# Patient Record
Sex: Female | Born: 1970
Health system: Southern US, Community
[De-identification: ages and names within clinical notes are randomized; demographics above are authoritative.]

## PROBLEM LIST (undated history)

## (undated) DIAGNOSIS — A64 Unspecified sexually transmitted disease: Secondary | ICD-10-CM

## (undated) HISTORY — DX: Unspecified sexually transmitted disease: A64

---

## 2003-05-29 ENCOUNTER — Emergency Department (HOSPITAL_COMMUNITY): Admission: EM | Admit: 2003-05-29 | Discharge: 2003-05-29 | Payer: Self-pay | Admitting: Emergency Medicine

## 2003-05-29 ENCOUNTER — Encounter: Payer: Self-pay | Admitting: Emergency Medicine

## 2004-06-25 ENCOUNTER — Emergency Department: Payer: Self-pay | Admitting: Emergency Medicine

## 2006-02-19 ENCOUNTER — Ambulatory Visit: Payer: Self-pay | Admitting: Family Medicine

## 2006-03-28 ENCOUNTER — Emergency Department: Payer: Self-pay | Admitting: Emergency Medicine

## 2007-05-30 IMAGING — CR DG HAND COMPLETE 3+V*L*
1 series · 3 of 3 positions shown · non-contrast
Comparison: none

REASON FOR EXAM: Pain and swelling
                    CALL REPORT
COMMENTS:

PROCEDURE:     DXR - DXR HAND LT COMPLETE  W/OBLIQUES  - February 19, 2006  [DATE]
RESULT:     Three views of the LEFT hand show no fracture, dislocation or
other significant osseous abnormality. No definite arthritic changes are
identified.

[Series 1: view not recorded · 0.17mm/px · 3 of 3 slices shown]
[im 1/3]
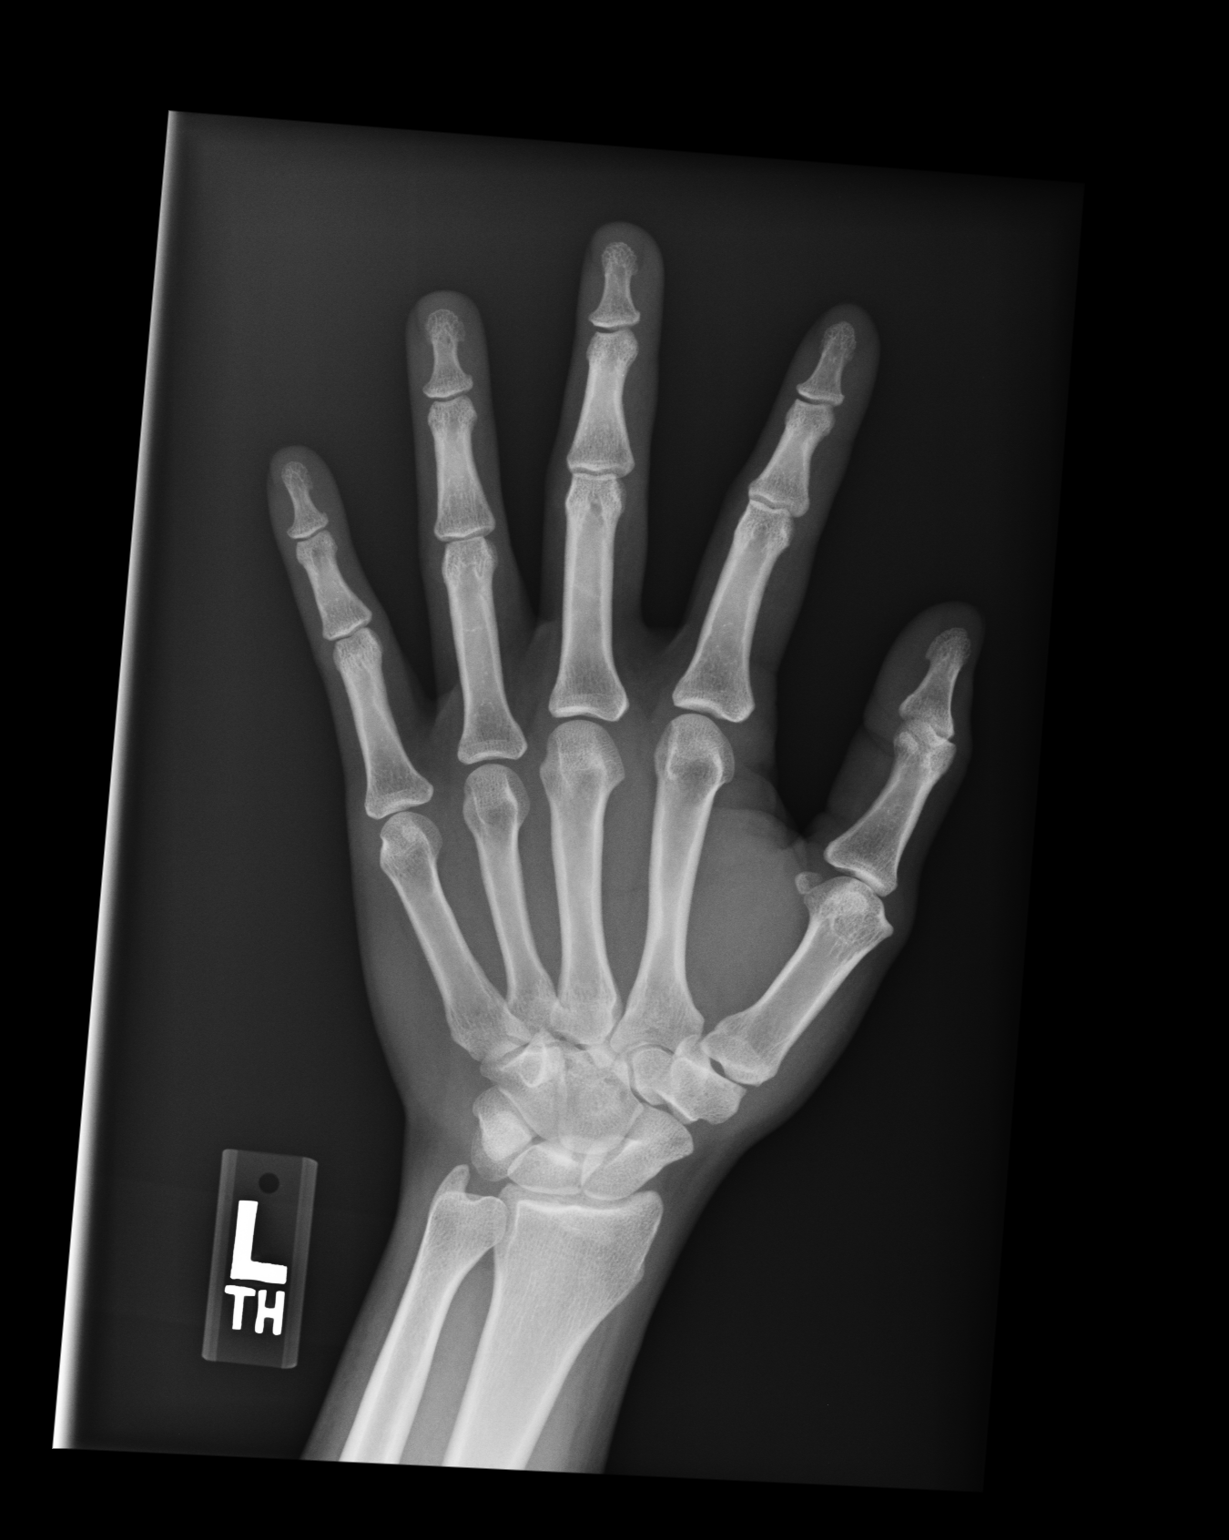
[im 2/3]
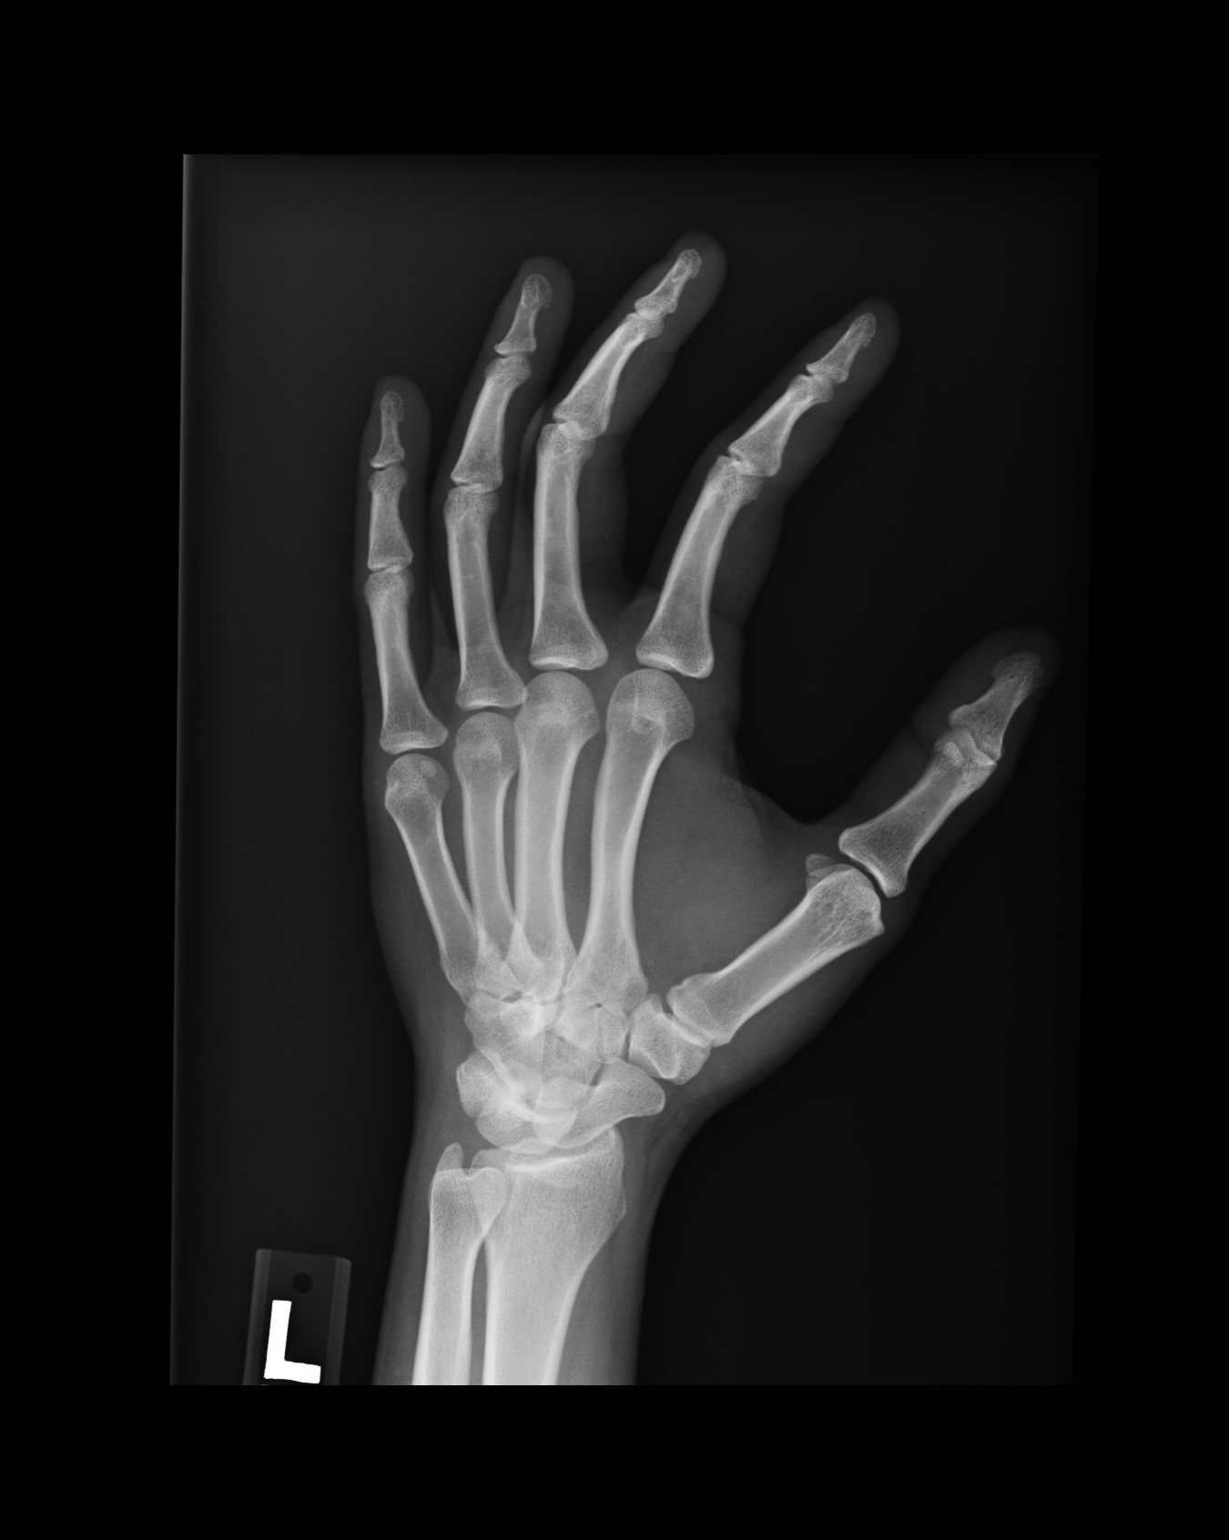
[im 3/3]
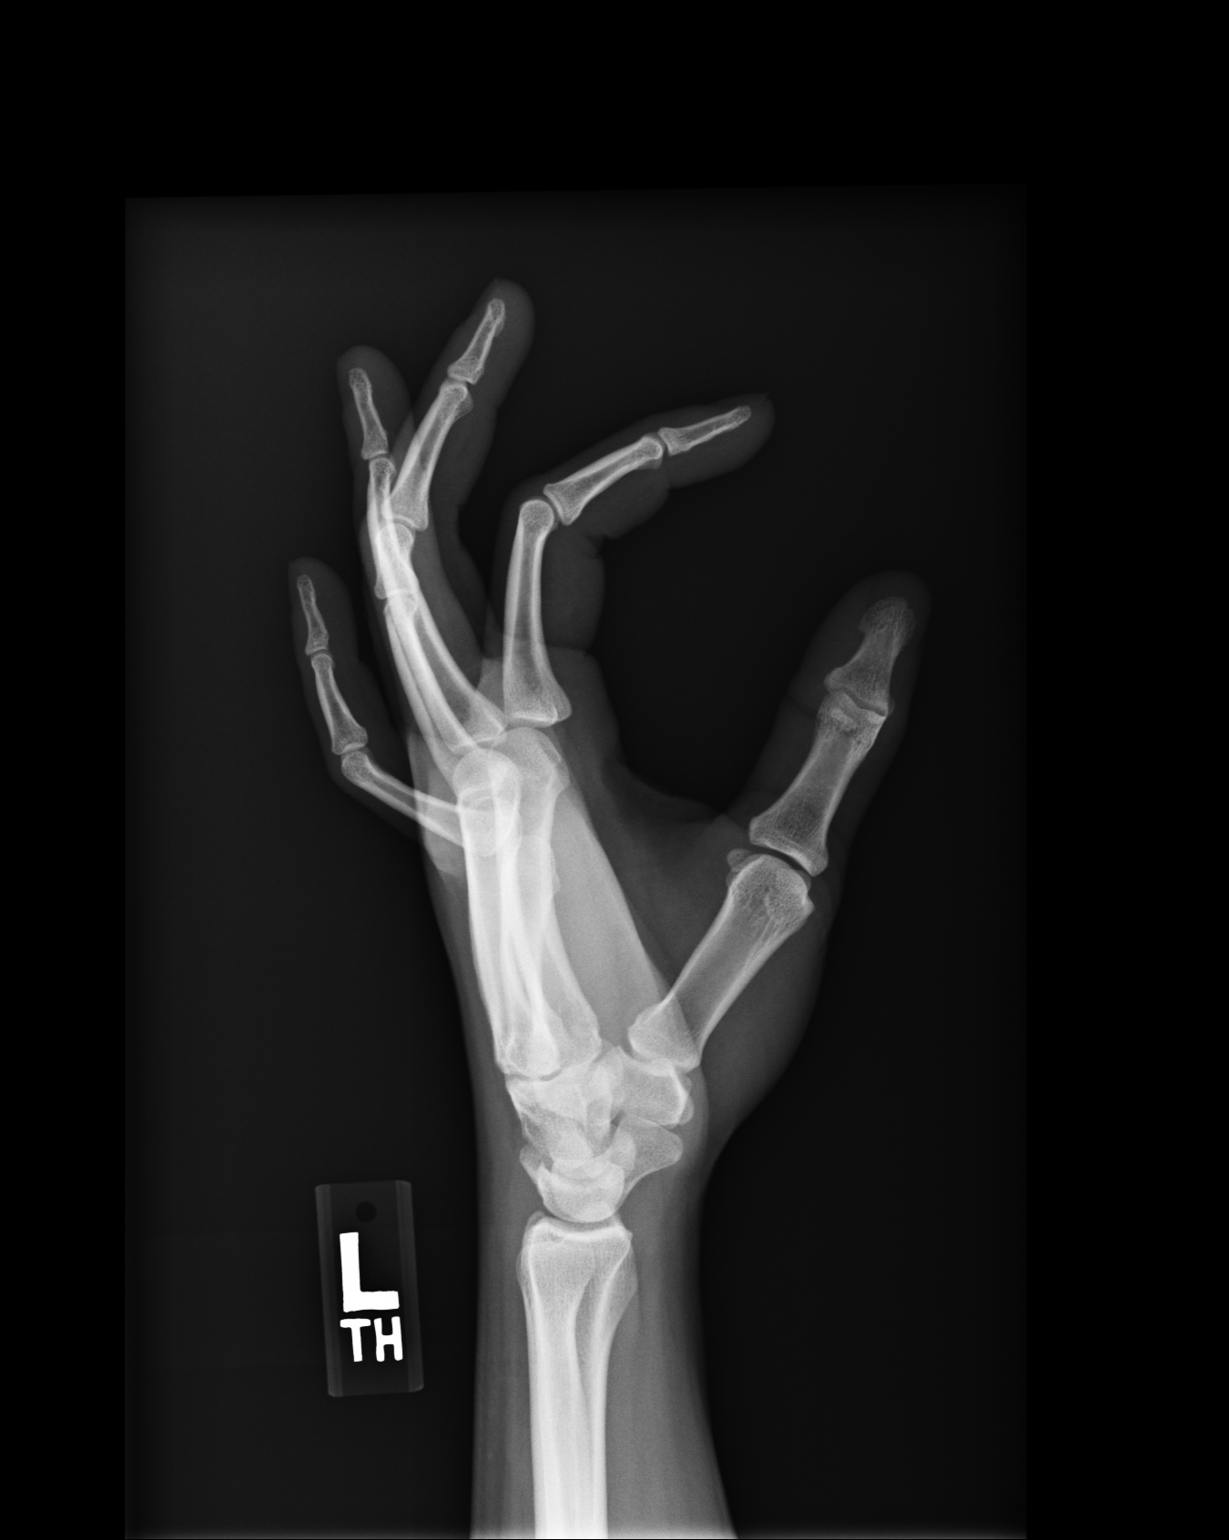

[3 of 3 positions shown; findings below may reference images not displayed]

IMPRESSION: No significant osseous abnormalities are identified.

## 2008-11-30 ENCOUNTER — Emergency Department: Payer: Self-pay | Admitting: Emergency Medicine

## 2010-11-28 ENCOUNTER — Emergency Department: Payer: Self-pay | Admitting: Emergency Medicine

## 2011-10-03 ENCOUNTER — Emergency Department: Payer: Self-pay | Admitting: Emergency Medicine

## 2015-04-20 ENCOUNTER — Encounter: Payer: Self-pay | Admitting: Emergency Medicine

## 2015-04-20 ENCOUNTER — Emergency Department
Admission: EM | Admit: 2015-04-20 | Discharge: 2015-04-20 | Disposition: A | Discharge status: Home / Self Care | Payer: Self-pay | Attending: Emergency Medicine | Admitting: Emergency Medicine

## 2015-04-20 DIAGNOSIS — B349 Viral infection, unspecified: Secondary | ICD-10-CM | POA: Insufficient documentation

## 2015-04-20 DIAGNOSIS — R10812 Left upper quadrant abdominal tenderness: Secondary | ICD-10-CM | POA: Insufficient documentation

## 2015-04-20 DIAGNOSIS — Z720 Tobacco use: Secondary | ICD-10-CM | POA: Insufficient documentation

## 2015-04-20 DIAGNOSIS — Z3202 Encounter for pregnancy test, result negative: Secondary | ICD-10-CM | POA: Insufficient documentation

## 2015-04-20 LAB — URINALYSIS COMPLETE WITH MICROSCOPIC (ARMC ONLY)
Bilirubin Urine: NEGATIVE
Glucose, UA: NEGATIVE mg/dL
KETONES UR: NEGATIVE mg/dL
Nitrite: NEGATIVE
PH: 6 (ref 5.0–8.0)
PROTEIN: NEGATIVE mg/dL
Specific Gravity, Urine: 1.021 (ref 1.005–1.030)

## 2015-04-20 LAB — COMPREHENSIVE METABOLIC PANEL
ALT: 12 U/L — ABNORMAL LOW (ref 14–54)
AST: 14 U/L — ABNORMAL LOW (ref 15–41)
Albumin: 3.5 g/dL (ref 3.5–5.0)
Alkaline Phosphatase: 87 U/L (ref 38–126)
Anion gap: 6 (ref 5–15)
BUN: 7 mg/dL (ref 6–20)
CO2: 26 mmol/L (ref 22–32)
Calcium: 9 mg/dL (ref 8.9–10.3)
Chloride: 107 mmol/L (ref 101–111)
Creatinine, Ser: 0.82 mg/dL (ref 0.44–1.00)
GFR calc Af Amer: >60 mL/min (ref >60)
GFR calc non Af Amer: >60 mL/min (ref >60)
Glucose, Bld: 103 mg/dL — ABNORMAL HIGH (ref 65–99)
Potassium: 3.7 mmol/L (ref 3.5–5.1)
Sodium: 139 mmol/L (ref 135–145)
Total Bilirubin: <0.1 mg/dL — ABNORMAL LOW (ref 0.3–1.2)
Total Protein: 6.8 g/dL (ref 6.5–8.1)

## 2015-04-20 LAB — CBC
HCT: 39.4 % (ref 35.0–47.0)
Hemoglobin: 13.4 g/dL (ref 12.0–16.0)
MCH: 28.4 pg (ref 26.0–34.0)
MCHC: 34.1 g/dL (ref 32.0–36.0)
MCV: 83.3 fL (ref 80.0–100.0)
Platelets: 297 10*3/uL (ref 150–440)
RBC: 4.73 MIL/uL (ref 3.80–5.20)
RDW: 14.2 % (ref 11.5–14.5)
WBC: 8.5 10*3/uL (ref 3.6–11.0)

## 2015-04-20 LAB — LIPASE, BLOOD: Lipase: 21 U/L — ABNORMAL LOW (ref 22–51)

## 2015-04-20 LAB — POCT PREGNANCY, URINE: PREG TEST UR: NEGATIVE

## 2015-04-20 MED ORDER — RANITIDINE HCL 150 MG PO CAPS: 150.0000 mg | ORAL_CAPSULE | Freq: Two times a day (BID) | ORAL | Status: DC

## 2015-04-20 MED ORDER — METOCLOPRAMIDE HCL 10 MG PO TABS: 10.0000 mg | ORAL_TABLET | Freq: Three times a day (TID) | ORAL | Status: DC

## 2015-04-20 MED ORDER — DIPHENHYDRAMINE HCL 2 % EX CREA: TOPICAL_CREAM | Freq: Three times a day (TID) | CUTANEOUS | Status: DC | PRN

## 2015-04-20 MED ORDER — DICYCLOMINE HCL 20 MG PO TABS: 20.0000 mg | ORAL_TABLET | Freq: Three times a day (TID) | ORAL | Status: DC | PRN

## 2015-04-20 NOTE — Discharge Instructions (Signed)

## 2015-04-20 NOTE — ED Notes (Addendum)
Patient ambulatory to triage with steady gait, without difficulty or distress noted; pt reports N/V/D x 2 days; denies abd pain

## 2015-04-20 NOTE — ED Provider Notes (Signed)
[facility] Emergency Department Provider Note  ____________________________________________  Time seen: 7:05 AM  I have reviewed the triage vital signs and the nursing notes.   HISTORY  Chief Complaint Emesis and Diarrhea    HPI Marcia Wu is a 44 y.o. female who complains of nausea vomiting and diarrhea for 2 days. No chest pain or shortness of breath. No fevers or chills. She reports feeling was bowel movements while at work and had to leave work. Able to eat and not having any exacerbating symptoms with eating. Not positional. She did recently have sinus congestion and runny nose.     History reviewed. No pertinent past medical history.   There are no active problems to display for this patient.    History reviewed. No pertinent past surgical history.   Current Outpatient Rx  Name  Route  Sig  Dispense  Refill  . dicyclomine (BENTYL) 20 MG tablet   Oral   Take 1 tablet (20 mg total) by mouth 3 (three) times daily as needed for spasms.   30 tablet   0   . metoCLOPramide (REGLAN) 10 MG tablet   Oral   Take 1 tablet (10 mg total) by mouth 4 (four) times daily -  before meals and at bedtime.   60 tablet   0   . ranitidine (ZANTAC) 150 MG capsule   Oral   Take 1 capsule (150 mg total) by mouth 2 (two) times daily.   28 capsule   0      Allergies Review of patient's allergies indicates no known allergies.   No family history on file.  Social History Social History  Substance Use Topics  . Smoking status: Current Every Day Smoker -- 1.00 packs/day    Types: Cigarettes  . Smokeless tobacco: None  . Alcohol Use: No    Review of Systems  Constitutional:   No fever or chills. No weight changes Eyes:   No blurry vision or double vision.  ENT:   No sore throat. Cardiovascular:   No chest pain. Respiratory:   No dyspnea or cough. Gastrointestinal:   Negative for abdominal pain positive vomiting and diarrhea  No BRBPR or  melena. Genitourinary:   Negative for dysuria, urinary retention, bloody urine, or difficulty urinating. Musculoskeletal:   Negative for back pain. No joint swelling or pain. Skin:   Negative for rash. Neurological:   Negative for headaches, focal weakness or numbness. Psychiatric:  No anxiety or depression.   Endocrine:  No hot/cold intolerance, changes in energy, or sleep difficulty.  10-point ROS otherwise negative.  ____________________________________________   PHYSICAL EXAM:  VITAL SIGNS: ED Triage Vitals  Enc Vitals Group     BP 04/20/15 0350 104/45 mmHg     Pulse Rate 04/20/15 0350 72     Resp 04/20/15 0350 20     Temp 04/20/15 0350 98.1 F (36.7 C)     Temp Source 04/20/15 0350 Oral     SpO2 04/20/15 0350 97 %     Weight 04/20/15 0350 190 lb (86.183 kg)     Height 04/20/15 0350 5\' 4"  (1.626 m)     Head Cir --      Peak Flow --      Pain Score --      Pain Loc --      Pain Edu? --      Excl. in [place]? --      Constitutional:   Alert and oriented. Well appearing and in no  distress. Eyes:   No scleral icterus. No conjunctival pallor. PERRL. EOMI ENT   Head:   Normocephalic and atraumatic.   Nose:   No congestion/rhinnorhea. No septal hematoma   Mouth/Throat:   MMM, no pharyngeal erythema. No peritonsillar mass. No uvula shift.   Neck:   No stridor. No SubQ emphysema. No meningismus. Hematological/Lymphatic/Immunilogical:   No cervical lymphadenopathy. Cardiovascular:   RRR. Normal and symmetric distal pulses are present in all extremities. No murmurs, rubs, or gallops. Respiratory:   Normal respiratory effort without tachypnea nor retractions. Breath sounds are clear and equal bilaterally. No wheezes/rales/rhonchi. Gastrointestinal:   Mild left upper quadrant tenderness. Abdomen soft. No distention. There is no CVA tenderness.  No rebound, rigidity, or guarding. Genitourinary:   deferred Musculoskeletal:   Nontender with normal range of motion in all  extremities. No joint effusions.  No lower extremity tenderness.  No edema. Neurologic:   Normal speech and language.  CN 2-10 normal. Motor grossly intact. No pronator drift.  Normal gait. No gross focal neurologic deficits are appreciated.  Skin:    Skin is warm, dry and intact. No rash noted.  No petechiae, purpura, or bullae. Psychiatric:   Mood and affect are normal. Speech and behavior are normal. Patient exhibits appropriate insight and judgment.  ____________________________________________    LABS (pertinent positives/negatives) (all labs ordered are listed, but only abnormal results are displayed) Labs Reviewed  LIPASE, BLOOD - Abnormal; Notable for the following:    Lipase 21 (*)    All other components within normal limits  COMPREHENSIVE METABOLIC PANEL - Abnormal; Notable for the following:    Glucose, Bld 103 (*)    AST 14 (*)    ALT 12 (*)    Total Bilirubin <0.1 (*)    All other components within normal limits  URINALYSIS COMPLETEWITH MICROSCOPIC ([facility] ONLY) - Abnormal; Notable for the following:    Color, Urine YELLOW (*)    APPearance HAZY (*)    Hgb urine dipstick 1+ (*)    Leukocytes, UA 3+ (*)    Bacteria, UA RARE (*)    Squamous Epithelial / LPF 0-5 (*)    All other components within normal limits  CBC  POC URINE PREG, ED  POCT PREGNANCY, URINE   ____________________________________________   EKG    ____________________________________________    RADIOLOGY    ____________________________________________   PROCEDURES   ____________________________________________   INITIAL IMPRESSION / ASSESSMENT AND PLAN / ED COURSE  Pertinent labs & imaging results that were available during my care of the patient were reviewed by me and considered in my medical decision making (see chart for details).  Labs unremarkable. Low suspicion for UTI or pyelonephritis. No evidence of surgical pathology. Patient's very well-appearing nontoxic no acute  distress, calm and comfortable. We'll give her some medications to help manage the symptoms which I expect her due to a viral illness which will resolve on its own.     ____________________________________________   FINAL CLINICAL IMPRESSION(S) / ED DIAGNOSES  Final diagnoses:  Viral syndrome      Carrie Mew, MD 04/20/15 [phone]

## 2015-04-28 ENCOUNTER — Emergency Department: Payer: Self-pay

## 2015-04-28 ENCOUNTER — Emergency Department
Admission: EM | Admit: 2015-04-28 | Discharge: 2015-04-28 | Disposition: A | Discharge status: Home / Self Care | Payer: Self-pay | Attending: Emergency Medicine | Admitting: Emergency Medicine

## 2015-04-28 DIAGNOSIS — S299XXA Unspecified injury of thorax, initial encounter: Secondary | ICD-10-CM | POA: Insufficient documentation

## 2015-04-28 DIAGNOSIS — Z79899 Other long term (current) drug therapy: Secondary | ICD-10-CM | POA: Insufficient documentation

## 2015-04-28 DIAGNOSIS — W1840XA Slipping, tripping and stumbling without falling, unspecified, initial encounter: Secondary | ICD-10-CM | POA: Insufficient documentation

## 2015-04-28 DIAGNOSIS — S93402A Sprain of unspecified ligament of left ankle, initial encounter: Secondary | ICD-10-CM | POA: Insufficient documentation

## 2015-04-28 DIAGNOSIS — M7732 Calcaneal spur, left foot: Secondary | ICD-10-CM | POA: Insufficient documentation

## 2015-04-28 DIAGNOSIS — M7662 Achilles tendinitis, left leg: Secondary | ICD-10-CM | POA: Insufficient documentation

## 2015-04-28 DIAGNOSIS — Z720 Tobacco use: Secondary | ICD-10-CM | POA: Insufficient documentation

## 2015-04-28 DIAGNOSIS — Y9289 Other specified places as the place of occurrence of the external cause: Secondary | ICD-10-CM | POA: Insufficient documentation

## 2015-04-28 DIAGNOSIS — Y9389 Activity, other specified: Secondary | ICD-10-CM | POA: Insufficient documentation

## 2015-04-28 DIAGNOSIS — Y990 Civilian activity done for income or pay: Secondary | ICD-10-CM | POA: Insufficient documentation

## 2015-04-28 MED ORDER — NAPROXEN 500 MG PO TABS
500.0000 mg | ORAL_TABLET | Freq: Once | ORAL | Status: AC
  Administered 2015-04-28: 500 mg via ORAL
  Filled 2015-04-28: qty 1

## 2015-04-28 MED ORDER — NAPROXEN 500 MG PO TABS: 500.0000 mg | ORAL_TABLET | Freq: Two times a day (BID) | ORAL | Status: DC

## 2015-04-28 MED ORDER — TRAMADOL HCL 50 MG PO TABS
50.0000 mg | ORAL_TABLET | Freq: Once | ORAL | Status: AC
  Administered 2015-04-28: 50 mg via ORAL
  Filled 2015-04-28: qty 1

## 2015-04-28 NOTE — ED Notes (Addendum)
Pt here for left foot and ankle pain.  Also complaining of rib pain on right side. Pain worsens with movement and when taking a deep breath.

## 2015-04-28 NOTE — Discharge Instructions (Signed)
Ankle Sprain °An ankle sprain is an injury to the strong, fibrous tissues (ligaments) that hold the bones of your ankle joint together.  °CAUSES °An ankle sprain is usually caused by a fall or by twisting your ankle. Ankle sprains most commonly occur when you step on the outer edge of your foot, and your ankle turns inward. People who participate in sports are more prone to these types of injuries.  °SYMPTOMS  °· Pain in your ankle. The pain may be present at rest or only when you are trying to stand or walk. °· Swelling. °· Bruising. Bruising may develop immediately or within 1 to 2 days after your injury. °· Difficulty standing or walking, particularly when turning corners or changing directions. °DIAGNOSIS  °Your caregiver will ask you details about your injury and perform a physical exam of your ankle to determine if you have an ankle sprain. During the physical exam, your caregiver will press on and apply pressure to specific areas of your foot and ankle. Your caregiver will try to move your ankle in certain ways. An X-ray exam may be done to be sure a bone was not broken or a ligament did not separate from one of the bones in your ankle (avulsion fracture).  °TREATMENT  °Certain types of braces can help stabilize your ankle. Your caregiver can make a recommendation for this. Your caregiver may recommend the use of medicine for pain. If your sprain is severe, your caregiver may refer you to a surgeon who helps to restore function to parts of your skeletal system (orthopedist) or a physical therapist. °HOME CARE INSTRUCTIONS  °· Apply ice to your injury for 1-2 days or as directed by your caregiver. Applying ice helps to reduce inflammation and pain. °· Put ice in a plastic bag. °· Place a towel between your skin and the bag. °· Leave the ice on for 15-20 minutes at a time, every 2 hours while you are awake. °· Only take over-the-counter or prescription medicines for pain, discomfort, or fever as directed by  your caregiver. °· Elevate your injured ankle above the level of your heart as much as possible for 2-3 days. °· If your caregiver recommends crutches, use them as instructed. Gradually put weight on the affected ankle. Continue to use crutches or a cane until you can walk without feeling pain in your ankle. °· If you have a plaster splint, wear the splint as directed by your caregiver. Do not rest it on anything harder than a pillow for the first 24 hours. Do not put weight on it. Do not get it wet. You may take it off to take a shower or bath. °· You may have been given an elastic bandage to wear around your ankle to provide support. If the elastic bandage is too tight (you have numbness or tingling in your foot or your foot becomes cold and blue), adjust the bandage to make it comfortable. °· If you have an air splint, you may blow more air into it or let air out to make it more comfortable. You may take your splint off at night and before taking a shower or bath. Wiggle your toes in the splint several times per day to decrease swelling. °SEEK MEDICAL CARE IF:  °· You have rapidly increasing bruising or swelling. °· Your toes feel extremely cold or you lose feeling in your foot. °· Your pain is not relieved with medicine. °SEEK IMMEDIATE MEDICAL CARE IF: °· Your toes are numb or blue. °·   You have severe pain that is increasing. MAKE SURE YOU:   Understand these instructions.  Will watch your condition.  Will get help right away if you are not doing well or get worse. Document Released: 08/07/2005 Document Revised: 05/01/2012 Document Reviewed: 08/19/2011 [facility] Patient Information 2015 [place], [place]. This information is not intended to replace advice given to you by your health care provider. Make sure you discuss any questions you have with your health care provider.  Achilles Tendinitis Achilles tendinitis is inflammation of the tough, cord-like band that attaches the lower muscles of your leg  to your heel (Achilles tendon). It is usually caused by overusing the tendon and joint involved.  CAUSES Achilles tendinitis can happen because of:  A sudden increase in exercise or activity (such as running).  Doing the same exercises or activities (such as jumping) over and over.  Not warming up calf muscles before exercising.  Exercising in shoes that are worn out or not made for exercise.  Having arthritis or a bone growth on the back of the heel bone. This can rub against the tendon and hurt the tendon. SIGNS AND SYMPTOMS The most common symptoms are:  Pain in the back of the leg, just above the heel. The pain usually gets worse with exercise and better with rest.  Stiffness or soreness in the back of the leg, especially in the morning.  Swelling of the skin over the Achilles tendon.  Trouble standing on tiptoe. Sometimes, an Achilles tendon tears (ruptures). Symptoms of an Achilles tendon rupture can include:  Sudden, severe pain in the back of the leg.  Trouble putting weight on the foot or walking normally. DIAGNOSIS Achilles tendinitis will be diagnosed based on symptoms and a physical examination. An X-ray may be done to check if another condition is causing your symptoms. An MRI may be ordered if your health care provider suspects you may have completely torn your tendon, which is called an Achilles tendon rupture.  TREATMENT  Achilles tendinitis usually gets better over time. It can take weeks to months to heal completely. Treatment focuses on treating the symptoms and helping the injury heal. HOME CARE INSTRUCTIONS   Rest your Achilles tendon and avoid activities that cause pain.  Apply ice to the injured area:  Put ice in a plastic bag.  Place a towel between your skin and the bag.  Leave the ice on for 20 minutes, 2-3 times a day  Try to avoid using the tendon (other than gentle range of motion) while the tendon is painful. Do not resume use until instructed  by your health care provider. Then begin use gradually. Do not increase use to the point of pain. If pain does develop, decrease use and continue the above measures. Gradually increase activities that do not cause discomfort until you achieve normal use.  Do exercises to make your calf muscles stronger and more flexible. Your health care provider or physical therapist can recommend exercises for you to do.  Wrap your ankle with an elastic bandage or other wrap. This can help keep your tendon from moving too much. Your health care provider will show you how to wrap your ankle correctly.  Only take over-the-counter or prescription medicines for pain, discomfort, or fever as directed by your health care provider. SEEK MEDICAL CARE IF:   Your pain and swelling increase or pain is uncontrolled with medicines.  You develop new, unexplained symptoms or your symptoms get worse.  You are unable to move your  toes or foot.  You develop warmth and swelling in your foot.  You have an unexplained temperature. MAKE SURE YOU:   Understand these instructions.  Will watch your condition.  Will get help right away if you are not doing well or get worse. Document Released: 05/17/2005 Document Revised: 05/28/2013 Document Reviewed: 03/19/2013 [facility] Patient Information 2015 [place], [place]. This information is not intended to replace advice given to you by your health care provider. Make sure you discuss any questions you have with your health care provider.

## 2015-04-28 NOTE — ED Notes (Signed)
Pt injured left ankle last PM at work, no swelling noted at this time.  Pedal pulse and cap refill WNL.

## 2015-04-28 NOTE — ED Provider Notes (Signed)
[facility] Emergency Department Provider Note  ____________________________________________  Time seen: Approximately 10:55 AM  I have reviewed the triage vital signs and the nursing notes.   HISTORY  Chief Complaint Foot Pain and Ankle Pain    HPI Marcia Wu is a 44 y.o. female complaining of left ankle pain secondary to a slip at work yesterday. 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 Patient states she went home secondary to the swelling and applied ice and use anti-inflammatory medications. Patient states she's continue to have pain although the swelling has resolved. Patient is aware that she's have Achilles and calcaneus heel spurs. Patient rated the pain as a 5/10.   No past medical history on file.  There are no active problems to display for this patient.   No past surgical history on file.  Current Outpatient Rx  Name  Route  Sig  Dispense  Refill  . dicyclomine (BENTYL) 20 MG tablet   Oral   Take 1 tablet (20 mg total) by mouth 3 (three) times daily as needed for spasms.   30 tablet   0   . diphenhydrAMINE (BENADRYL) 2 % cream   Topical   Apply topically 3 (three) times daily as needed for itching.   30 g   0   . metoCLOPramide (REGLAN) 10 MG tablet   Oral   Take 1 tablet (10 mg total) by mouth 4 (four) times daily -  before meals and at bedtime.   60 tablet   0   . ranitidine (ZANTAC) 150 MG capsule   Oral   Take 1 capsule (150 mg total) by mouth 2 (two) times daily.   28 capsule   0     Allergies Review of patient's allergies indicates no known allergies.  No family history on file.  Social History Social History  Substance Use Topics  . Smoking status: Current Every Day Smoker -- 1.00 packs/day    Types: Cigarettes  . Smokeless tobacco: Not on file  . Alcohol Use: No    Review of Systems Constitutional: No fever/chills Eyes: No visual changes. ENT: No sore  throat. Cardiovascular: Denies chest pain. Respiratory: Denies shortness of breath. Gastrointestinal: No abdominal pain.  No nausea, no vomiting.  No diarrhea.  No constipation. Genitourinary: Negative for dysuria. Musculoskeletal: Left ankle pain and right lateral chest wall pain. Skin: Negative for rash. Neurological: Negative for headaches, focal weakness or numbness. 10-point ROS otherwise negative.  ____________________________________________   PHYSICAL EXAM:  VITAL SIGNS: ED Triage Vitals  Enc Vitals Group     BP 04/28/15 1042 130/58 mmHg     Pulse Rate 04/28/15 1042 88     Resp 04/28/15 1042 16     Temp 04/28/15 1042 98.8 F (37.1 C)     Temp Source 04/28/15 1042 Oral     SpO2 04/28/15 1042 99 %     Weight --      Height --      Head Cir --      Peak Flow --      Pain Score 04/28/15 1042 5     Pain Loc --      Pain Edu? --      Excl. in [place]? --     Constitutional: Alert and oriented. Well appearing and in no acute distress. Eyes: Conjunctivae are normal. PERRL. EOMI. Head: Atraumatic. Nose: No congestion/rhinnorhea. Mouth/Throat: Mucous membranes are moist.  Oropharynx non-erythematous. Neck: No stridor.  No  cervical spine tenderness to palpation. Hematological/Lymphatic/Immunilogical: No cervical lymphadenopathy. Cardiovascular: Normal rate, regular rhythm. Grossly normal heart sounds.  Good peripheral circulation. Respiratory: Normal respiratory effort.  No retractions. Lungs CTAB. Gastrointestinal: Soft and non Ntender. No distention. No abdominal bruits. No CVA tenderness. Musculoskeletal no chest wall deformity. No abrasion or ecchymosis. Patient has equal lung expansion. Tender to palpation intercostal area of seventh and eighth rib.  Bilateral pes planus file any obvious edema to the ankle foot. Patient has some moderate guarding palpation of the medial malleolus patient neurovascular intact. Patient has atypical gait secondary to complain of pain at the  left ankle.  Neurologic:  Normal speech and language. No gross focal neurologic deficits are appreciated. No gait instability. Skin:  Skin is warm, dry and intact. No rash noted. Psychiatric: Mood and affect are normal. Speech and behavior are normal.  ____________________________________________   LABS (all labs ordered are listed, but only abnormal results are displayed)  Labs Reviewed - No data to display ____________________________________________  EKG   ____________________________________________  RADIOLOGY  No acute findings. Incidental findings of the heel and Achilles spur. I, Sable Feil, personally viewed and evaluated these images (plain radiographs) as part of my medical decision making.   ____________________________________________   PROCEDURES  Procedure(s) performed: None  Critical Care performed: No  ____________________________________________   INITIAL IMPRESSION / ASSESSMENT AND PLAN / ED COURSE  Pertinent labs & imaging results that were available during my care of the patient were reviewed by me and considered in my medical decision making (see chart for details).  Sprain left ankle. Achilles and heel spur. Discussed x-ray findings: Patient placed in a Velcro ankle support. Patient given a prescription for naproxen. Patient given advice on home care. Second diagnosis costochondritis. Patient given home instructions and advised to take naproxen also for this complaint. Patient given a work excuse for today. Patient advised follow-up with podiatry for continued care for her heel and Achilles spur. ____________________________________________   FINAL CLINICAL IMPRESSION(S) / ED DIAGNOSES  Final diagnoses:  Left ankle sprain, initial encounter  Heel spur, left  Achilles tendinitis of left lower extremity      Sable Feil, PA-C 04/28/15 [place], MD 04/28/15 [phone]

## 2015-06-23 ENCOUNTER — Institutional Professional Consult (permissible substitution): Payer: Self-pay | Admitting: Certified Nurse Midwife

## 2015-06-25 ENCOUNTER — Emergency Department
Admission: EM | Admit: 2015-06-25 | Discharge: 2015-06-25 | Disposition: A | Discharge status: Home / Self Care | Payer: Self-pay | Attending: Emergency Medicine | Admitting: Emergency Medicine

## 2015-06-25 DIAGNOSIS — Z720 Tobacco use: Secondary | ICD-10-CM | POA: Insufficient documentation

## 2015-06-25 DIAGNOSIS — Z791 Long term (current) use of non-steroidal anti-inflammatories (NSAID): Secondary | ICD-10-CM | POA: Insufficient documentation

## 2015-06-25 DIAGNOSIS — K529 Noninfective gastroenteritis and colitis, unspecified: Secondary | ICD-10-CM | POA: Insufficient documentation

## 2015-06-25 DIAGNOSIS — Z3202 Encounter for pregnancy test, result negative: Secondary | ICD-10-CM | POA: Insufficient documentation

## 2015-06-25 DIAGNOSIS — Z79899 Other long term (current) drug therapy: Secondary | ICD-10-CM | POA: Insufficient documentation

## 2015-06-25 LAB — COMPREHENSIVE METABOLIC PANEL
ALK PHOS: 87 U/L (ref 38–126)
ALT: 12 U/L — AB (ref 14–54)
AST: 15 U/L (ref 15–41)
Albumin: 3.6 g/dL (ref 3.5–5.0)
Anion gap: 2 — ABNORMAL LOW (ref 5–15)
BUN: 12 mg/dL (ref 6–20)
CALCIUM: 8.9 mg/dL (ref 8.9–10.3)
CHLORIDE: 109 mmol/L (ref 101–111)
CO2: 28 mmol/L (ref 22–32)
CREATININE: 0.83 mg/dL (ref 0.44–1.00)
GFR calc Af Amer: >60 mL/min (ref >60)
Glucose, Bld: 109 mg/dL — ABNORMAL HIGH (ref 65–99)
Potassium: 3.9 mmol/L (ref 3.5–5.1)
Sodium: 139 mmol/L (ref 135–145)
Total Bilirubin: 0.2 mg/dL — ABNORMAL LOW (ref 0.3–1.2)
Total Protein: 7.1 g/dL (ref 6.5–8.1)

## 2015-06-25 LAB — URINALYSIS COMPLETE WITH MICROSCOPIC (ARMC ONLY)
BACTERIA UA: NONE SEEN
Bilirubin Urine: NEGATIVE
Glucose, UA: NEGATIVE mg/dL
KETONES UR: NEGATIVE mg/dL
Nitrite: NEGATIVE
PROTEIN: NEGATIVE mg/dL
Specific Gravity, Urine: 1.015 (ref 1.005–1.030)
WBC UA: NONE SEEN WBC/hpf (ref 0–5)
pH: 6 (ref 5.0–8.0)

## 2015-06-25 LAB — CBC WITH DIFFERENTIAL/PLATELET
BASOS ABS: 0.1 10*3/uL (ref 0–0.1)
Basophils Relative: 1 %
EOS PCT: 6 %
Eosinophils Absolute: 0.5 10*3/uL (ref 0–0.7)
HEMATOCRIT: 40.6 % (ref 35.0–47.0)
HEMOGLOBIN: 13.7 g/dL (ref 12.0–16.0)
LYMPHS ABS: 2.1 10*3/uL (ref 1.0–3.6)
LYMPHS PCT: 24 %
MCH: 27.8 pg (ref 26.0–34.0)
MCHC: 33.9 g/dL (ref 32.0–36.0)
MCV: 82.1 fL (ref 80.0–100.0)
Monocytes Absolute: 0.4 10*3/uL (ref 0.2–0.9)
Monocytes Relative: 5 %
NEUTROS ABS: 5.8 10*3/uL (ref 1.4–6.5)
NEUTROS PCT: 64 %
PLATELETS: 287 10*3/uL (ref 150–440)
RBC: 4.94 MIL/uL (ref 3.80–5.20)
RDW: 14.5 % (ref 11.5–14.5)
WBC: 9 10*3/uL (ref 3.6–11.0)

## 2015-06-25 LAB — PREGNANCY, URINE: PREG TEST UR: NEGATIVE

## 2015-06-25 MED ORDER — ONDANSETRON 4 MG PO TBDP: 4.0000 mg | ORAL_TABLET | Freq: Three times a day (TID) | ORAL | Status: DC | PRN

## 2015-06-25 MED ORDER — ONDANSETRON HCL 4 MG/2ML IJ SOLN
4.0000 mg | Freq: Once | INTRAMUSCULAR | Status: AC
  Administered 2015-06-25: 4 mg via INTRAVENOUS
  Filled 2015-06-25: qty 2

## 2015-06-25 MED ORDER — LOPERAMIDE HCL 2 MG PO CAPS
4.0000 mg | ORAL_CAPSULE | Freq: Once | ORAL | Status: AC
  Administered 2015-06-25: 4 mg via ORAL
  Filled 2015-06-25: qty 2

## 2015-06-25 MED ORDER — SODIUM CHLORIDE 0.9 % IV BOLUS (SEPSIS)
1000.0000 mL | Freq: Once | INTRAVENOUS | Status: AC
  Administered 2015-06-25: 1000 mL via INTRAVENOUS

## 2015-06-25 NOTE — ED Provider Notes (Signed)
[facility] Emergency Department Provider Note  ____________________________________________  Time seen: 5:45 AM I have reviewed the triage vital signs and the nursing notes.   HISTORY  Chief Complaint Nausea     HPI Marcia Wu is a 44 y.o. 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 Patient states multiple sick contacts at home. Patient also admits to generalized abdominal cramping that is intermittent none at present.she denies any fever    Past medical history None There are no active problems to display for this patient.  Past surgical history None  Current Outpatient Rx  Name  Route  Sig  Dispense  Refill  . dicyclomine (BENTYL) 20 MG tablet   Oral   Take 1 tablet (20 mg total) by mouth 3 (three) times daily as needed for spasms.   30 tablet   0   . diphenhydrAMINE (BENADRYL) 2 % cream   Topical   Apply topically 3 (three) times daily as needed for itching.   30 g   0   . metoCLOPramide (REGLAN) 10 MG tablet   Oral   Take 1 tablet (10 mg total) by mouth 4 (four) times daily -  before meals and at bedtime.   60 tablet   0   . naproxen (NAPROSYN) 500 MG tablet   Oral   Take 1 tablet (500 mg total) by mouth 2 (two) times daily with a meal.   20 tablet   0   . ranitidine (ZANTAC) 150 MG capsule   Oral   Take 1 capsule (150 mg total) by mouth 2 (two) times daily.   28 capsule   0     Allergies No known drug allergies No family history on file.  Social History Social History  Substance Use Topics  . Smoking status: Current Every Day Smoker -- 1.00 packs/day    Types: Cigarettes  . Smokeless tobacco: None  . Alcohol Use: No    Review of Systems  Constitutional: Negative for fever. Eyes: Negative for visual changes. ENT: Negative for sore throat. Cardiovascular: Negative for chest pain. Respiratory: Negative for shortness  of breath. Gastrointestinal: positive for vomiting and diarrhea Genitourinary: Negative for dysuria. Musculoskeletal: Negative for back pain. Skin: Negative for rash. Neurological: Negative for headaches, focal weakness or numbness.   10-point ROS otherwise negative.  ____________________________________________   PHYSICAL EXAM:  VITAL SIGNS: ED Triage Vitals  Enc Vitals Group     BP 06/25/15 0035 120/63 mmHg     Pulse Rate 06/25/15 0035 88     Resp 06/25/15 0035 18     Temp 06/25/15 0035 98.2 F (36.8 C)     Temp Source 06/25/15 0035 Oral     SpO2 06/25/15 0035 98 %     Weight 06/25/15 0035 180 lb (81.647 kg)     Height 06/25/15 0035 5\' 4"  (1.626 m)     Head Cir --      Peak Flow --      Pain Score 06/25/15 0039 5     Pain Loc --      Pain Edu? --      Excl. in [place]? --      Constitutional: Alert and oriented. Well appearing and in no distress. Eyes: Conjunctivae are normal. PERRL. Normal extraocular movements. ENT   Head: Normocephalic and atraumatic.   Nose: No congestion/rhinnorhea.   Mouth/Throat: dry oral nasal mucosa.   Neck: No  stridor. Hematological/Lymphatic/Immunilogical: No cervical lymphadenopathy. Cardiovascular: Normal rate, regular rhythm. Normal and symmetric distal pulses are present in all extremities. No murmurs, rubs, or gallops. Respiratory: Normal respiratory effort without tachypnea nor retractions. Breath sounds are clear and equal bilaterally. No wheezes/rales/rhonchi. Gastrointestinal: Soft and nontender. No distention. There is no CVA tenderness. Genitourinary: deferred Musculoskeletal: Nontender with normal range of motion in all extremities. No joint effusions.  No lower extremity tenderness nor edema. Neurologic:  Normal speech and language. No gross focal neurologic deficits are appreciated. Speech is normal.  Skin:  Skin is warm, dry and intact. No rash noted. Psychiatric: Mood and affect are normal. Speech and behavior are  normal. Patient exhibits appropriate insight and judgment.  ____________________________________________    LABS (pertinent positives/negatives)  Labs Reviewed  COMPREHENSIVE METABOLIC PANEL - Abnormal; Notable for the following:    Glucose, Bld 109 (*)    ALT 12 (*)    Total Bilirubin 0.2 (*)    Anion gap 2 (*)    All other components within normal limits  URINALYSIS COMPLETEWITH MICROSCOPIC ([facility] ONLY) - Abnormal; Notable for the following:    Color, Urine YELLOW (*)    APPearance CLEAR (*)    Hgb urine dipstick 1+ (*)    Leukocytes, UA TRACE (*)    Squamous Epithelial / LPF 0-5 (*)    All other components within normal limits  CBC WITH DIFFERENTIAL/PLATELET  PREGNANCY, URINE      \   INITIAL IMPRESSION / ASSESSMENT AND PLAN / ED COURSE  Pertinent labs & imaging results that were available during my care of the patient were reviewed by me and considered in my medical decision making (see chart for details).  Given absence of abdominal pain on exam in addition to unremarkable laboratory data CT scan of the abdomen was not performed. History and physical exam consistent with most likely viral gastroenteritis. Patient received IV Zofran and normal saline and stated "I feel much better".  ____________________________________________   FINAL CLINICAL IMPRESSION(S) / ED DIAGNOSES  Final diagnoses:  Gastroenteritis      Gregor Hams, MD 06/25/15 [phone]

## 2015-06-25 NOTE — ED Notes (Signed)
Lab notified to add on urine pregnancy to urine already in lab.

## 2015-06-25 NOTE — ED Notes (Signed)
Patient states she has not been feeling well for the last three days. N/V/D x 4 last night. OUt of work Midwife for same. Same number of incidents also tonight. Denies fever at home. Able to keep small sips of sprite or gingerale down.

## 2015-06-25 NOTE — ED Notes (Signed)
MD at bedside for reeval

## 2015-06-25 NOTE — ED Notes (Signed)
Pt ambulatory to and from restroom without incident.

## 2015-11-02 ENCOUNTER — Emergency Department: Payer: Self-pay

## 2015-11-02 ENCOUNTER — Emergency Department
Admission: EM | Admit: 2015-11-02 | Discharge: 2015-11-02 | Disposition: A | Discharge status: Home / Self Care | Payer: Self-pay | Attending: Emergency Medicine | Admitting: Emergency Medicine

## 2015-11-02 ENCOUNTER — Encounter: Payer: Self-pay | Admitting: Emergency Medicine

## 2015-11-02 DIAGNOSIS — Z791 Long term (current) use of non-steroidal anti-inflammatories (NSAID): Secondary | ICD-10-CM | POA: Insufficient documentation

## 2015-11-02 DIAGNOSIS — R000 Tachycardia, unspecified: Secondary | ICD-10-CM | POA: Insufficient documentation

## 2015-11-02 DIAGNOSIS — Z79899 Other long term (current) drug therapy: Secondary | ICD-10-CM | POA: Insufficient documentation

## 2015-11-02 DIAGNOSIS — B349 Viral infection, unspecified: Secondary | ICD-10-CM | POA: Insufficient documentation

## 2015-11-02 DIAGNOSIS — J069 Acute upper respiratory infection, unspecified: Secondary | ICD-10-CM | POA: Insufficient documentation

## 2015-11-02 DIAGNOSIS — F17210 Nicotine dependence, cigarettes, uncomplicated: Secondary | ICD-10-CM | POA: Insufficient documentation

## 2015-11-02 LAB — BASIC METABOLIC PANEL
ANION GAP: 6 (ref 5–15)
BUN: 7 mg/dL (ref 6–20)
CALCIUM: 8.5 mg/dL — AB (ref 8.9–10.3)
CO2: 22 mmol/L (ref 22–32)
CREATININE: 0.82 mg/dL (ref 0.44–1.00)
Chloride: 107 mmol/L (ref 101–111)
Glucose, Bld: 105 mg/dL — ABNORMAL HIGH (ref 65–99)
Potassium: 3.4 mmol/L — ABNORMAL LOW (ref 3.5–5.1)
SODIUM: 135 mmol/L (ref 135–145)

## 2015-11-02 LAB — CBC
HEMATOCRIT: 44.4 % (ref 35.0–47.0)
Hemoglobin: 15.4 g/dL (ref 12.0–16.0)
MCH: 27.4 pg (ref 26.0–34.0)
MCHC: 34.7 g/dL (ref 32.0–36.0)
MCV: 79.1 fL — ABNORMAL LOW (ref 80.0–100.0)
PLATELETS: 273 10*3/uL (ref 150–440)
RBC: 5.61 MIL/uL — ABNORMAL HIGH (ref 3.80–5.20)
RDW: 14.6 % — AB (ref 11.5–14.5)
WBC: 9 10*3/uL (ref 3.6–11.0)

## 2015-11-02 LAB — TROPONIN I: Troponin I: <0.03 ng/mL (ref <0.031)

## 2015-11-02 LAB — RAPID INFLUENZA A&B ANTIGENS (ARMC ONLY): INFLUENZA A (ARMC): NEGATIVE

## 2015-11-02 LAB — RAPID INFLUENZA A&B ANTIGENS: Influenza B (ARMC): NEGATIVE

## 2015-11-02 MED ORDER — ACETAMINOPHEN 500 MG PO TABS
1000.0000 mg | ORAL_TABLET | ORAL | Status: AC
  Administered 2015-11-02: 1000 mg via ORAL
  Filled 2015-11-02: qty 2

## 2015-11-02 MED ORDER — BENZONATATE 100 MG PO CAPS: 100.0000 mg | ORAL_CAPSULE | Freq: Four times a day (QID) | ORAL | Status: DC | PRN

## 2015-11-02 MED ORDER — SODIUM CHLORIDE 0.9 % IV BOLUS (SEPSIS)
1000.0000 mL | Freq: Once | INTRAVENOUS | Status: AC
  Administered 2015-11-02: 1000 mL via INTRAVENOUS

## 2015-11-02 NOTE — ED Notes (Signed)
States she developed some cough and congestion since yesterday  Having some pain in chest with cough and movement

## 2015-11-02 NOTE — ED Provider Notes (Signed)
[facility] Emergency Department Provider Note  ____________________________________________  Time seen: Approximately 7:59 AM  I have reviewed the triage vital signs and the nursing notes.   HISTORY  Chief Complaint Cough and Nasal Congestion    HPI Marcia Wu is a 45 y.o. female presents for evaluation of chills, cough, chest congestion starting yesterday.  She reports that she started feeling achiness in her chest worse by coughing. Slight nonproductive cough. Also some generalized achiness. No headache. No abdominal pain nausea or vomiting.  Denies pregnancy. Denies trouble urinating. No pain or burning with urination.  She was able to go and have breakfast this morning, and had her daughter drive her here for coughing.   History reviewed. No pertinent past medical history.  There are no active problems to display for this patient.   History reviewed. No pertinent past surgical history.  Current Outpatient Rx  Name  Route  Sig  Dispense  Refill  . benzonatate (TESSALON PERLES) 100 MG capsule   Oral   Take 1 capsule (100 mg total) by mouth every 6 (six) hours as needed for cough.   20 capsule   0   . dicyclomine (BENTYL) 20 MG tablet   Oral   Take 1 tablet (20 mg total) by mouth 3 (three) times daily as needed for spasms.   30 tablet   0   . diphenhydrAMINE (BENADRYL) 2 % cream   Topical   Apply topically 3 (three) times daily as needed for itching.   30 g   0   . metoCLOPramide (REGLAN) 10 MG tablet   Oral   Take 1 tablet (10 mg total) by mouth 4 (four) times daily -  before meals and at bedtime.   60 tablet   0   . naproxen (NAPROSYN) 500 MG tablet   Oral   Take 1 tablet (500 mg total) by mouth 2 (two) times daily with a meal.   20 tablet   0   . ondansetron (ZOFRAN ODT) 4 MG disintegrating tablet   Oral   Take 1 tablet (4 mg total) by mouth every 8 (eight) hours as needed for nausea or vomiting.   20 tablet    0   . ranitidine (ZANTAC) 150 MG capsule   Oral   Take 1 capsule (150 mg total) by mouth 2 (two) times daily.   28 capsule   0     Allergies Review of patient's allergies indicates no known allergies.  No family history on file.  Social History Social History  Substance Use Topics  . Smoking status: Current Every Day Smoker -- 1.00 packs/day    Types: Cigarettes  . Smokeless tobacco: None  . Alcohol Use: No    Review of Systems Constitutional: No fever but feeling "chills" Eyes: No visual changes. ENT: No sore throat. Cardiovascular: Denies chest pain except it feels a little achy when coughing. Respiratory: Denies shortness of breath, positive for cough. Gastrointestinal: No abdominal pain.  No nausea, no vomiting.  No diarrhea.  No constipation. Genitourinary: Negative for dysuria. Musculoskeletal: Negative for back pain. Skin: Negative for rash. Neurological: Negative for headaches, focal weakness or numbness.  10-point ROS otherwise negative.  ____________________________________________   PHYSICAL EXAM:  VITAL SIGNS: ED Triage Vitals  Enc Vitals Group     BP 11/02/15 0744 88/65 mmHg     Pulse Rate 11/02/15 0744 102     Resp 11/02/15 0744 20     Temp 11/02/15 0744 98.6 F (37  C)     Temp Source 11/02/15 0744 Oral     SpO2 11/02/15 0744 98 %     Weight 11/02/15 0744 180 lb (81.647 kg)     Height 11/02/15 0744 5\' 4"  (1.626 m)     Head Cir --      Peak Flow --      Pain Score --      Pain Loc --      Pain Edu? --      Excl. in [place]? --    Constitutional: Alert and oriented. Well appearing and in no acute distressThough does appear mildly fatigued. Eyes: Conjunctivae are normal. PERRL. EOMI. Head: Atraumatic. Nose: Clear coryza Mouth/Throat: Mucous membranes are moist.  Oropharynx non-erythematous. Neck: No stridor.  No meningismus. Cardiovascular: Just slightly tachycardic rate, regular rhythm. Grossly normal heart sounds.  Good peripheral  circulation. Respiratory: Normal respiratory effort.  No retractions. Lungs CTAB. Gastrointestinal: Soft and nontender. No distention. No abdominal bruits. No CVA tenderness. Musculoskeletal: No lower extremity tenderness nor edema.  No joint effusions. Neurologic:  Normal speech and language. No gross focal neurologic deficits are appreciated. Skin:  Skin is warm, dry and intact. No rash noted. Psychiatric: Mood and affect are normal. Speech and behavior are normal.  ____________________________________________   LABS (all labs ordered are listed, but only abnormal results are displayed)  Labs Reviewed  CBC - Abnormal; Notable for the following:    RBC 5.61 (*)    MCV 79.1 (*)    RDW 14.6 (*)    All other components within normal limits  BASIC METABOLIC PANEL - Abnormal; Notable for the following:    Potassium 3.4 (*)    Glucose, Bld 105 (*)    Calcium 8.5 (*)    All other components within normal limits  RAPID INFLUENZA A&B ANTIGENS ([facility] ONLY)  TROPONIN I   ____________________________________________  EKG  ED ECG REPORT I, Inocente Krach, the attending physician, personally viewed and interpreted this ECG.  Date: 11/02/2015 EKG Time: 2010 Rate: 75 Rhythm: normal sinus rhythm QRS Axis: normal Intervals: normal ST/T Wave abnormalities: normal Conduction Disturbances: none Narrative Interpretation: unremarkable  ____________________________________________  RADIOLOGY  DG Chest 2 View (Final result) Result time: 11/02/15 08:45:20   Final result by Rad Results In Interface (11/02/15 08:45:20)   Narrative:   CLINICAL DATA: Cough for 2-3 days  EXAM: CHEST 2 VIEW  COMPARISON: None.  FINDINGS: The heart size and mediastinal contours are within normal limits. Both lungs are clear. The visualized skeletal structures are unremarkable.  IMPRESSION: No active cardiopulmonary disease.   Electronically Signed By: Inez Catalina M.D. On: 11/02/2015 08:45        ____________________________________________   PROCEDURES  Procedure(s) performed: None  Critical Care performed: No  ____________________________________________   INITIAL IMPRESSION / ASSESSMENT AND PLAN / ED COURSE  Pertinent labs & imaging results that were available during my care of the patient were reviewed by me and considered in my medical decision making (see chart for details).  Patient presents for cough, runny nose. Patient reports that symptoms started yesterday. She does appear awake alert in no distress. Slightly fatigued. She denies chest pain consistent with acute coronary syndrome, rather reports symptoms of discomfort in the chest brought about by coughing.  No pleuritic pain. She does have coryza, dry nonproductive cough. No hypoxia. She was able to ambulate well to the ER, taking by mouth and had breakfast this morning. EKG, chest x-ray and labs are very reassuring. She is negative for influenza. She  does not demonstrate high-grade fever or severe myalgias, and I suspect likely patient has upper respiratory illness, viral. No evidence of wheezing hypoxia or increased work of breathing.  ----------------------------------------- 10:03 AM on 11/02/2015 -----------------------------------------  On reevaluation patient awake alert states she feels better. I will give her prescription for Tessalon, discussed careful return precautions, her daughter is driving her home, and she did request a work note today. She is fully alert, oriented 4 at this time. No distress. ____________________________________________   FINAL CLINICAL IMPRESSION(S) / ED DIAGNOSES  Final diagnoses:  Upper respiratory infection  Viral illness      Delman Kitten, MD 11/02/15 1005

## 2015-11-02 NOTE — Discharge Instructions (Signed)
You have been seen in the Emergency Department (ED) today for a likely viral illness.  Please drink plenty of clear fluids (water, Gatorade, chicken broth, etc).  You may use Tylenol and/or Motrin according to label instructions.  You can alternate between the two without any side effects.   Please follow up with your doctor as listed above.  Call your doctor or return to the Emergency Department (ED) if you are unable to tolerate fluids due to vomiting, have worsening trouble breathing, become extremely tired or difficult to awaken, or if you develop any other symptoms that concern you.   Upper Respiratory Infection, Adult Most upper respiratory infections (URIs) are a viral infection of the air passages leading to the lungs. A URI affects the nose, throat, and upper air passages. The most common type of URI is nasopharyngitis and is typically referred to as "the common cold." URIs run their course and usually go away on their own. Most of the time, a URI does not require medical attention, but sometimes a bacterial infection in the upper airways can follow a viral infection. This is called a secondary infection. Sinus and middle ear infections are common types of secondary upper respiratory infections. Bacterial pneumonia can also complicate a URI. A URI can worsen asthma and chronic obstructive pulmonary disease (COPD). Sometimes, these complications can require emergency medical care and may be life threatening.  CAUSES Almost all URIs are caused by viruses. A virus is a type of germ and can spread from one person to another.  RISKS FACTORS You may be at risk for a URI if:   You smoke.   You have chronic heart or lung disease.  You have a weakened defense (immune) system.   You are very young or very old.   You have nasal allergies or asthma.  You work in crowded or poorly ventilated areas.  You work in health care facilities or schools. SIGNS AND SYMPTOMS  Symptoms typically  develop 2-3 days after you come in contact with a cold virus. Most viral URIs last 7-10 days. However, viral URIs from the influenza virus (flu virus) can last 14-18 days and are typically more severe. Symptoms may include:   Runny or stuffy (congested) nose.   Sneezing.   Cough.   Sore throat.   Headache.   Fatigue.   Fever.   Loss of appetite.   Pain in your forehead, behind your eyes, and over your cheekbones (sinus pain).  Muscle aches.  DIAGNOSIS  Your health care provider may diagnose a URI by:  Physical exam.  Tests to check that your symptoms are not due to another condition such as:  Strep throat.  Sinusitis.  Pneumonia.  Asthma. TREATMENT  A URI goes away on its own with time. It cannot be cured with medicines, but medicines may be prescribed or recommended to relieve symptoms. Medicines may help:  Reduce your fever.  Reduce your cough.  Relieve nasal congestion. HOME CARE INSTRUCTIONS   Take medicines only as directed by your health care provider.   Gargle warm saltwater or take cough drops to comfort your throat as directed by your health care provider.  Use a warm mist humidifier or inhale steam from a shower to increase air moisture. This may make it easier to breathe.  Drink enough fluid to keep your urine clear or pale yellow.   Eat soups and other clear broths and maintain good nutrition.   Rest as needed.   Return to work when your  temperature has returned to normal or as your health care provider advises. You may need to stay home longer to avoid infecting others. You can also use a face mask and careful hand washing to prevent spread of the virus.  Increase the usage of your inhaler if you have asthma.   Do not use any tobacco products, including cigarettes, chewing tobacco, or electronic cigarettes. If you need help quitting, ask your health care provider. PREVENTION  The best way to protect yourself from getting a cold  is to practice good hygiene.   Avoid oral or hand contact with people with cold symptoms.   Wash your hands often if contact occurs.  There is no clear evidence that vitamin C, vitamin E, echinacea, or exercise reduces the chance of developing a cold. However, it is always recommended to get plenty of rest, exercise, and practice good nutrition.  SEEK MEDICAL CARE IF:   You are getting worse rather than better.   Your symptoms are not controlled by medicine.   You have chills.  You have worsening shortness of breath.  You have brown or red mucus.  You have yellow or brown nasal discharge.  You have pain in your face, especially when you bend forward.  You have a fever.  You have swollen neck glands.  You have pain while swallowing.  You have white areas in the back of your throat. SEEK IMMEDIATE MEDICAL CARE IF:   You have severe or persistent:  Headache.  Ear pain.  Sinus pain.  Chest pain.  You have chronic lung disease and any of the following:  Wheezing.  Prolonged cough.  Coughing up blood.  A change in your usual mucus.  You have a stiff neck.  You have changes in your:  Vision.  Hearing.  Thinking.  Mood. MAKE SURE YOU:   Understand these instructions.  Will watch your condition.  Will get help right away if you are not doing well or get worse.   This information is not intended to replace advice given to you by your health care provider. Make sure you discuss any questions you have with your health care provider.   Document Released: 01/31/2001 Document Revised: 12/22/2014 Document Reviewed: 11/12/2013 Elsevier Interactive Patient Education [employer].

## 2015-11-02 NOTE — ED Notes (Signed)
Pt states she has been coughing for 2-3 days, with thick sputum that is yellowish/green.  Pt to ED with central chest pain that started last night, with shortness of breath and night sweats last night.  Pt states chest pain is worse with deep breaths and coughing.    Pt denies fever or chills.

## 2016-09-28 ENCOUNTER — Emergency Department
Admission: EM | Admit: 2016-09-28 | Discharge: 2016-09-28 | Disposition: A | Discharge status: Home / Self Care | Payer: Self-pay | Attending: Emergency Medicine | Admitting: Emergency Medicine

## 2016-09-28 ENCOUNTER — Encounter: Payer: Self-pay | Admitting: Emergency Medicine

## 2016-09-28 DIAGNOSIS — H1031 Unspecified acute conjunctivitis, right eye: Secondary | ICD-10-CM

## 2016-09-28 DIAGNOSIS — F17210 Nicotine dependence, cigarettes, uncomplicated: Secondary | ICD-10-CM | POA: Insufficient documentation

## 2016-09-28 DIAGNOSIS — H1089 Other conjunctivitis: Secondary | ICD-10-CM | POA: Insufficient documentation

## 2016-09-28 MED ORDER — GENTAMICIN SULFATE 0.3 % OP SOLN: 2.0000 [drp] | OPHTHALMIC | 0 refills | Status: DC

## 2016-09-28 NOTE — ED Provider Notes (Signed)
[facility] Emergency Department Provider Note   ____________________________________________   First MD Initiated Contact with Patient 09/28/16 1239     (approximate)  I have reviewed the triage vital signs and the nursing notes.   HISTORY  Chief Complaint Conjunctivitis   HPI Marcia Wu is a 46 y.o. female is here with complaint of possible pinkeye to her right eye. Patient states that last evening she began feeling a "grit" sensation in her right eye. Small and she woke up and her lashes had a material in it that she was able to remove and wiped her eye to get some secretions of the corner of her eye and went back to sleep. When she awoke couple of hours later she experienced the same with drainage. Patient denies any foreign body sensation. Patient states that she is around a lot of small children but is not aware that any of these have conjunctivitis at this time. She denies any visual changes other than the redness and the exudate.   No past medical history on file.  There are no active problems to display for this patient.   History reviewed. No pertinent surgical history.  Prior to Admission medications   Medication Sig Start Date End Date Taking? Authorizing Provider  gentamicin (GARAMYCIN) 0.3 % ophthalmic solution Place 2 drops into the right eye every 4 (four) hours. While awake 09/28/16   Johnn Hai, PA-C  ondansetron (ZOFRAN ODT) 4 MG disintegrating tablet Take 1 tablet (4 mg total) by mouth every 8 (eight) hours as needed for nausea or vomiting. 06/25/15   Gregor Hams, MD  ranitidine (ZANTAC) 150 MG capsule Take 1 capsule (150 mg total) by mouth 2 (two) times daily. 04/20/15   Carrie Mew, MD    Allergies Patient has no known allergies.  No family history on file.  Social History Social History  Substance Use Topics  . Smoking status: Current Every Day Smoker    Packs/day: 1.00    Types: Cigarettes  .  Smokeless tobacco: Never Used  . Alcohol use No    Review of Systems Constitutional: No fever/chills Eyes: No visual changes.Positive exudate and redness right eye. ENT: No sore throat. Cardiovascular: Denies chest pain. Respiratory: Denies shortness of breath. Gastrointestinal:  No nausea, no vomiting.   Musculoskeletal: Negative for muscle aches. Skin: Negative for rash. Neurological: Negative for headaches   10-point ROS otherwise negative.  ____________________________________________   PHYSICAL EXAM:  VITAL SIGNS: ED Triage Vitals [09/28/16 1216]  Enc Vitals Group     BP 129/60     Pulse Rate 76     Resp 16     Temp      Temp src      SpO2 99 %     Weight 170 lb (77.1 kg)     Height 5\' 4"  (1.626 m)     Head Circumference      Peak Flow      Pain Score      Pain Loc      Pain Edu?      Excl. in [place]?     Constitutional: Alert and oriented. Well appearing and in no acute distress. Eyes: Conjunctivae are normalLeft eye, right eye is injected with exudate present in the corner and lashes also contained some exudate. Lid was inverted and no foreign body was noted. PERRL. EOMI. Head: Atraumatic. Nose: No congestion/rhinnorhea. Neck: No stridor.   Hematological/Lymphatic/Immunilogical: No cervical lymphadenopathy. Cardiovascular: Normal rate, regular rhythm. Grossly normal  heart sounds.  Good peripheral circulation. Respiratory: Normal respiratory effort.  No retractions. Lungs CTAB. Musculoskeletal: No lower extremity tenderness nor edema.  No joint effusions. Neurologic:  Normal speech and language. No gross focal neurologic deficits are appreciated. No gait instability. Skin:  Skin is warm, dry and intact. No rash noted. Psychiatric: Mood and affect are normal. Speech and behavior are normal.  __  Visual Acuity  Right Eye Distance:   Left Eye Distance:   Bilateral Distance:    Right Eye Near: R Near: 20/40 Left Eye Near:  L Near: 20/25 Bilateral Near:    __________________________________________   LABS (all labs ordered are listed, but only abnormal results are displayed)  Labs Reviewed - No data to display   PROCEDURES  Procedure(s) performed: None  Procedures  Critical Care performed: No  ____________________________________________   INITIAL IMPRESSION / ASSESSMENT AND PLAN / ED COURSE  Pertinent labs & imaging results that were available during my care of the patient were reviewed by me and considered in my medical decision making (see chart for details).  Patient was given a prescription for gentamicin ophthalmic solution to apply to right eye 4 times a day while awake. Patient was encouraged to wash her hands each time she touched her eye that should she develop symptoms in her left eye she may begin using the eyedrops to the left as well. She is to follow-up with her PCP or [facility] if any continued problems.      ____________________________________________   FINAL CLINICAL IMPRESSION(S) / ED DIAGNOSES  Final diagnoses:  Acute bacterial conjunctivitis of right eye      NEW MEDICATIONS STARTED DURING THIS VISIT:  Discharge Medication List as of 09/28/2016  1:20 PM    START taking these medications   Details  gentamicin (GARAMYCIN) 0.3 % ophthalmic solution Place 2 drops into the right eye every 4 (four) hours. While awake, Starting Thu 09/28/2016, Print         Note:  This document was prepared using Dragon voice recognition software and may include unintentional dictation errors.    Johnn Hai, PA-C 09/28/16 [place], MD 09/28/16 [phone]

## 2016-09-28 NOTE — ED Triage Notes (Signed)
Pt with possible pink eye in the right eye. Redness and pain started last night.

## 2016-09-28 NOTE — Discharge Instructions (Signed)
Follow-up [facility] if any continued problems. If you begin having symptoms in your left eye began using the drops there is well. Remember to wash your  hands immediately after touching your right eye. This is contagious and frequently can be spread.

## 2017-11-23 ENCOUNTER — Other Ambulatory Visit: Payer: Self-pay

## 2017-11-23 ENCOUNTER — Emergency Department (HOSPITAL_COMMUNITY)
Admission: EM | Admit: 2017-11-23 | Discharge: 2017-11-23 | Disposition: A | Discharge status: Home / Self Care | Payer: Self-pay | Attending: Emergency Medicine | Admitting: Emergency Medicine

## 2017-11-23 DIAGNOSIS — N10 Acute tubulo-interstitial nephritis: Secondary | ICD-10-CM | POA: Insufficient documentation

## 2017-11-23 DIAGNOSIS — F17210 Nicotine dependence, cigarettes, uncomplicated: Secondary | ICD-10-CM | POA: Insufficient documentation

## 2017-11-23 LAB — URINALYSIS, ROUTINE W REFLEX MICROSCOPIC
Bilirubin Urine: NEGATIVE
Glucose, UA: NEGATIVE mg/dL
KETONES UR: 80 mg/dL — AB
NITRITE: POSITIVE — AB
PH: 7 (ref 5.0–8.0)
PROTEIN: 100 mg/dL — AB
Specific Gravity, Urine: 1.02 (ref 1.005–1.030)

## 2017-11-23 LAB — RAPID URINE DRUG SCREEN, HOSP PERFORMED
Amphetamines: NOT DETECTED
BENZODIAZEPINES: NOT DETECTED
Barbiturates: NOT DETECTED
COCAINE: NOT DETECTED
Opiates: NOT DETECTED
Tetrahydrocannabinol: POSITIVE — AB

## 2017-11-23 LAB — CBC WITH DIFFERENTIAL/PLATELET
BASOS PCT: 0 %
Basophils Absolute: 0 10*3/uL (ref 0.0–0.1)
EOS ABS: 0 10*3/uL (ref 0.0–0.7)
Eosinophils Relative: 0 %
HCT: 41.3 % (ref 36.0–46.0)
HEMOGLOBIN: 13.5 g/dL (ref 12.0–15.0)
Lymphocytes Relative: 8 %
Lymphs Abs: 0.8 10*3/uL (ref 0.7–4.0)
MCH: 27.8 pg (ref 26.0–34.0)
MCHC: 32.7 g/dL (ref 30.0–36.0)
MCV: 85 fL (ref 78.0–100.0)
Monocytes Absolute: 0.3 10*3/uL (ref 0.1–1.0)
Monocytes Relative: 3 %
NEUTROS PCT: 89 %
Neutro Abs: 8.9 10*3/uL — ABNORMAL HIGH (ref 1.7–7.7)
PLATELETS: 320 10*3/uL (ref 150–400)
RBC: 4.86 MIL/uL (ref 3.87–5.11)
RDW: 14.4 % (ref 11.5–15.5)
WBC: 10 10*3/uL (ref 4.0–10.5)

## 2017-11-23 LAB — COMPREHENSIVE METABOLIC PANEL
ALK PHOS: 87 U/L (ref 38–126)
ALT: 13 U/L — ABNORMAL LOW (ref 14–54)
AST: 19 U/L (ref 15–41)
Albumin: 3.4 g/dL — ABNORMAL LOW (ref 3.5–5.0)
Anion gap: 12 (ref 5–15)
BILIRUBIN TOTAL: 1.2 mg/dL (ref 0.3–1.2)
BUN: 10 mg/dL (ref 6–20)
CALCIUM: 9 mg/dL (ref 8.9–10.3)
CO2: 22 mmol/L (ref 22–32)
CREATININE: 0.77 mg/dL (ref 0.44–1.00)
Chloride: 108 mmol/L (ref 101–111)
Glucose, Bld: 156 mg/dL — ABNORMAL HIGH (ref 65–99)
Potassium: 3.4 mmol/L — ABNORMAL LOW (ref 3.5–5.1)
Sodium: 142 mmol/L (ref 135–145)
Total Protein: 7.1 g/dL (ref 6.5–8.1)

## 2017-11-23 LAB — PREGNANCY, URINE: PREG TEST UR: NEGATIVE

## 2017-11-23 LAB — LIPASE, BLOOD: LIPASE: 18 U/L (ref 11–51)

## 2017-11-23 MED ORDER — FENTANYL CITRATE (PF) 100 MCG/2ML IJ SOLN
100.0000 ug | Freq: Once | INTRAMUSCULAR | Status: AC
  Administered 2017-11-23: 100 ug via INTRAVENOUS
  Filled 2017-11-23: qty 2

## 2017-11-23 MED ORDER — ONDANSETRON HCL 4 MG/2ML IJ SOLN
4.0000 mg | Freq: Once | INTRAMUSCULAR | Status: AC
  Administered 2017-11-23: 4 mg via INTRAVENOUS
  Filled 2017-11-23: qty 2

## 2017-11-23 MED ORDER — CEPHALEXIN 500 MG PO CAPS: 500.0000 mg | ORAL_CAPSULE | Freq: Four times a day (QID) | ORAL | 0 refills | Status: DC

## 2017-11-23 MED ORDER — SODIUM CHLORIDE 0.9 % IV BOLUS
1000.0000 mL | Freq: Once | INTRAVENOUS | Status: AC
  Administered 2017-11-23: 1000 mL via INTRAVENOUS

## 2017-11-23 MED ORDER — SODIUM CHLORIDE 0.9 % IV SOLN
1.0000 g | Freq: Once | INTRAVENOUS | Status: AC
  Administered 2017-11-23: 1 g via INTRAVENOUS
  Filled 2017-11-23: qty 10

## 2017-11-23 NOTE — ED Notes (Signed)
Pt ambulatory to waiting room. Pt verbalized understanding of discharge instructions.   

## 2017-11-23 NOTE — ED Triage Notes (Signed)
Pt was picked up by [facility] at work. Pt C/O abdominal pain that started yesterday morning and has progressively gotten worse over the last 56min. Pt states she have have N/V the last hour.

## 2017-11-23 NOTE — ED Provider Notes (Signed)
[facility] EMERGENCY DEPARTMENT Provider Note   CSN: [id number] Arrival date & time: 11/23/17  0254     History   Chief Complaint Chief Complaint  Patient presents with  . Abdominal Pain    HPI Marcia Wu is a 47 y.o. female.  The history is provided by the patient.  Abdominal Pain   This is a new problem. The current episode started 6 to 12 hours ago. The problem occurs constantly. The problem has been gradually worsening. The pain is located in the generalized abdominal region. The pain is severe. Associated symptoms include nausea, vomiting and dysuria. Pertinent negatives include fever. The symptoms are aggravated by palpation. Nothing relieves the symptoms.   Patient presents with generalized abdominal pain that started over 6-12 hours ago.  She reports nausea vomiting.  She reports she was at work tonight when it got worse.  She has never had the pain before.   PMH-none Soc hx - smoker OB History   None      Home Medications    Prior to Admission medications   Not on File    Family History No family history on file.  Social History Social History   Tobacco Use  . Smoking status: Current Every Day Smoker    Packs/day: 1.00    Types: Cigarettes  . Smokeless tobacco: Never Used  Substance Use Topics  . Alcohol use: No  . Drug use: Not on file     Allergies   Patient has no known allergies.   Review of Systems Review of Systems  Constitutional: Negative for fever.  Cardiovascular: Negative for chest pain.  Gastrointestinal: Positive for abdominal pain, nausea and vomiting.  Genitourinary: Positive for dysuria.  Musculoskeletal: Positive for back pain.  All other systems reviewed and are negative.    Physical Exam Updated Vital Signs BP 132/69 (BP Location: Left Arm)   Pulse (!) 53   Temp 98.7 F (37.1 C) (Oral)   Resp 20   Wt 77.1 kg (170 lb)   SpO2 100%   BMI 29.18 kg/m   Physical Exam CONSTITUTIONAL: Well developed/well  nourished, anxious and uncomfortable appearing HEAD: Normocephalic/atraumatic EYES: EOMI/PERRL, no icterus ENMT: Mucous membranes dry NECK: supple no meningeal signs SPINE/BACK:entire spine nontender CV: S1/S2 noted, no murmurs/rubs/gallops noted LUNGS: Lungs are clear to auscultation bilaterally, no apparent distress ABDOMEN: soft, diffuse moderate tenderness, no rebound or guarding, bowel sounds noted throughout abdomen GU:cva tenderness NEURO: Pt is awake/alert/appropriate, moves all extremitiesx4.  No facial droop.   EXTREMITIES: pulses normal/equal, full ROM SKIN: warm, color normal PSYCH: anxious ED Treatments / Results  Labs (all labs ordered are listed, but only abnormal results are displayed) Labs Reviewed  URINALYSIS, ROUTINE W REFLEX MICROSCOPIC - Abnormal; Notable for the following components:      Result Value   APPearance CLOUDY (*)    Hgb urine dipstick MODERATE (*)    Ketones, ur 80 (*)    Protein, ur 100 (*)    Nitrite POSITIVE (*)    Leukocytes, UA MODERATE (*)    Bacteria, UA RARE (*)    Squamous Epithelial / LPF 0-5 (*)    Non Squamous Epithelial 0-5 (*)    All other components within normal limits  RAPID URINE DRUG SCREEN, HOSP PERFORMED - Abnormal; Notable for the following components:   Tetrahydrocannabinol POSITIVE (*)    All other components within normal limits  COMPREHENSIVE METABOLIC PANEL - Abnormal; Notable for the following components:   Potassium 3.4 (*)  Glucose, Bld 156 (*)    Albumin 3.4 (*)    ALT 13 (*)    All other components within normal limits  CBC WITH DIFFERENTIAL/PLATELET - Abnormal; Notable for the following components:   Neutro Abs 8.9 (*)    All other components within normal limits  LIPASE, BLOOD  PREGNANCY, URINE    EKG EKG Interpretation  Date/Time:  Friday November 23 2017 03:53:56 EDT Ventricular Rate:  63 PR Interval:    QRS Duration: 93 QT Interval:  490 QTC Calculation: 502 R Axis:   30 Text Interpretation:   Sinus rhythm Right atrial enlargement Borderline prolonged QT interval No significant change since last tracing Confirmed by Ripley Fraise [phone]) on 11/23/2017 4:02:16 AM   Radiology No results found.  Procedures Procedures  Medications Ordered in ED Medications  ondansetron (ZOFRAN) injection 4 mg (4 mg Intravenous Given 11/23/17 0346)  fentaNYL (SUBLIMAZE) injection 100 mcg (100 mcg Intravenous Given 11/23/17 0346)  sodium chloride 0.9 % bolus 1,000 mL (0 mLs Intravenous Stopped 11/23/17 0531)  cefTRIAXone (ROCEPHIN) 1 g in sodium chloride 0.9 % 100 mL IVPB (0 g Intravenous Stopped 11/23/17 0531)     Initial Impression / Assessment and Plan / ED Course  I have reviewed the triage vital signs and the nursing notes.  Pertinent labs results that were available during my care of the patient were reviewed by me and considered in my medical decision making (see chart for details).     4:29 AM Overall patient is now improved after IV fluids and pain medicine.  She is awake and alert, no distress she does report having recent back pain and dysuria, suspect she has UTI or potentially early pyelonephritis 5:40 AM Remaining labs unremarkable.  Patient improved.  No focal abdominal tenderness. With history of dysuria, back pain and evidence of UTI, will treat for pyelonephritis. She has been loaded with Rocephin.  She is taking p.o. fluids.  Will discharge home. Keflex for 10 days.  We discussed strict return precautions.  Since pain is completely resolved, I do not feel emergent imaging is required  Final Clinical Impressions(s) / ED Diagnoses   Final diagnoses:  Acute pyelonephritis    ED Discharge Orders        Ordered    cephALEXin (KEFLEX) 500 MG capsule  4 times daily     11/23/17 0529       Ripley Fraise, MD 11/23/17 [phone]

## 2018-02-23 ENCOUNTER — Emergency Department (HOSPITAL_COMMUNITY)
Admission: EM | Admit: 2018-02-23 | Discharge: 2018-02-23 | Disposition: A | Discharge status: Home / Self Care | Payer: BLUE CROSS/BLUE SHIELD | Attending: Emergency Medicine | Admitting: Emergency Medicine

## 2018-02-23 ENCOUNTER — Encounter (HOSPITAL_COMMUNITY): Payer: Self-pay | Admitting: *Deleted

## 2018-02-23 ENCOUNTER — Other Ambulatory Visit: Payer: Self-pay

## 2018-02-23 DIAGNOSIS — N3000 Acute cystitis without hematuria: Secondary | ICD-10-CM

## 2018-02-23 DIAGNOSIS — R300 Dysuria: Secondary | ICD-10-CM | POA: Diagnosis not present

## 2018-02-23 DIAGNOSIS — F17210 Nicotine dependence, cigarettes, uncomplicated: Secondary | ICD-10-CM | POA: Diagnosis not present

## 2018-02-23 DIAGNOSIS — R350 Frequency of micturition: Secondary | ICD-10-CM | POA: Diagnosis present

## 2018-02-23 DIAGNOSIS — Z7982 Long term (current) use of aspirin: Secondary | ICD-10-CM | POA: Diagnosis not present

## 2018-02-23 LAB — URINALYSIS, ROUTINE W REFLEX MICROSCOPIC
BILIRUBIN URINE: NEGATIVE
Bacteria, UA: NONE SEEN
Glucose, UA: NEGATIVE mg/dL
Ketones, ur: NEGATIVE mg/dL
Nitrite: NEGATIVE
PH: 5 (ref 5.0–8.0)
Protein, ur: 100 mg/dL — AB
SPECIFIC GRAVITY, URINE: 1.028 (ref 1.005–1.030)
WBC, UA: >50 WBC/hpf — ABNORMAL HIGH (ref 0–5)

## 2018-02-23 LAB — PREGNANCY, URINE: PREG TEST UR: NEGATIVE

## 2018-02-23 MED ORDER — SULFAMETHOXAZOLE-TRIMETHOPRIM 800-160 MG PO TABS
1.0000 | ORAL_TABLET | Freq: Once | ORAL | Status: AC
  Administered 2018-02-23: 1 via ORAL
  Filled 2018-02-23: qty 1

## 2018-02-23 MED ORDER — LIDOCAINE VISCOUS HCL 2 % MT SOLN
15.0000 mL | Freq: Once | OROMUCOSAL | Status: AC
  Administered 2018-02-23: 15 mL via OROMUCOSAL
  Filled 2018-02-23: qty 15

## 2018-02-23 MED ORDER — PHENAZOPYRIDINE HCL 200 MG PO TABS: 200.0000 mg | ORAL_TABLET | Freq: Three times a day (TID) | ORAL | 0 refills | Status: DC

## 2018-02-23 MED ORDER — SULFAMETHOXAZOLE-TRIMETHOPRIM 800-160 MG PO TABS: 1.0000 | ORAL_TABLET | Freq: Two times a day (BID) | ORAL | 0 refills | Status: AC

## 2018-02-23 NOTE — ED Provider Notes (Signed)
[facility] EMERGENCY DEPARTMENT Provider Note   CSN: [id number] Arrival date & time: 02/23/18  2104     History   Chief Complaint Chief Complaint  Patient presents with  . Recurrent UTI    HPI Marcia Wu is a 47 y.o. female presenting a recurrence of suspected urinary tract infection, last treated here in April for similar symptoms. Including darker than normal urine, in addition to increased frequency of urination, painful, urgency and passage of small amounts of urine.  She denies hematuria, back, flank pain and denies fevers, chills, n/v or abdominal pain. She does endorse vaginal discharge which she describes as white, sometimes thick and has been present for years and usually accompanied by a strong odor.  She has had no treatments prior to arrival.  The history is provided by the patient.    History reviewed. No pertinent past medical history.  There are no active problems to display for this patient.   History reviewed. No pertinent surgical history.   OB History   None      Home Medications    Prior to Admission medications   Medication Sig Start Date End Date Taking? Authorizing Provider  ASPIRIN PO Take 1 tablet by mouth once as needed (for pain).   Yes [provider]  phenazopyridine (PYRIDIUM) 200 MG tablet Take 1 tablet (200 mg total) by mouth 3 (three) times daily. 02/23/18   Evalee Jefferson, PA-C  sulfamethoxazole-trimethoprim (BACTRIM DS,SEPTRA DS) 800-160 MG tablet Take 1 tablet by mouth 2 (two) times daily for 10 days. 02/23/18 03/05/18  Evalee Jefferson, PA-C    Family History No family history on file.  Social History Social History   Tobacco Use  . Smoking status: Current Every Day Smoker    Packs/day: 1.00    Types: Cigarettes  . Smokeless tobacco: Never Used  Substance Use Topics  . Alcohol use: No  . Drug use: Not on file     Allergies   Patient has no known allergies.   Review of Systems Review of Systems  Constitutional:  Negative for fever.  HENT: Negative for congestion and sore throat.   Eyes: Negative.   Respiratory: Negative for chest tightness and shortness of breath.   Cardiovascular: Negative for chest pain.  Gastrointestinal: Negative for abdominal pain and nausea.  Genitourinary: Positive for dysuria, frequency, urgency and vaginal discharge. Negative for hematuria and vaginal pain.  Musculoskeletal: Negative for arthralgias, joint swelling and neck pain.  Skin: Negative.  Negative for rash and wound.  Neurological: Negative for dizziness, weakness, light-headedness, numbness and headaches.  Psychiatric/Behavioral: Negative.      Physical Exam Updated Vital Signs BP (!) 110/52   Pulse 76   Temp 98.2 F (36.8 C) (Oral)   Resp 16   Ht 5\' 5"  (1.651 m)   Wt 64.9 kg (143 lb)   SpO2 100%   BMI 23.80 kg/m   Physical Exam  Constitutional: She appears well-developed and well-nourished.  HENT:  Head: Normocephalic and atraumatic.  Eyes: Conjunctivae are normal.  Neck: Normal range of motion.  Cardiovascular: Normal rate, regular rhythm, normal heart sounds and intact distal pulses.  Pulmonary/Chest: Effort normal and breath sounds normal. She has no wheezes.  Abdominal: Soft. Bowel sounds are normal. She exhibits no mass. There is no tenderness. There is no guarding.  Genitourinary:  Genitourinary Comments: Pt deferred.  Musculoskeletal: Normal range of motion. She exhibits no tenderness or deformity.  Neurological: She is alert.  Skin: Skin is warm and dry.  Psychiatric: She has a normal mood and affect.  Nursing note and vitals reviewed.    ED Treatments / Results  Labs (all labs ordered are listed, but only abnormal results are displayed) Labs Reviewed  URINALYSIS, ROUTINE W REFLEX MICROSCOPIC - Abnormal; Notable for the following components:      Result Value   Color, Urine AMBER (*)    APPearance CLOUDY (*)    Hgb urine dipstick LARGE (*)    Protein, ur 100 (*)     Leukocytes, UA MODERATE (*)    RBC / HPF >50 (*)    WBC, UA >50 (*)    All other components within normal limits  URINE CULTURE  PREGNANCY, URINE    EKG None  Radiology No results found.  Procedures Procedures (including critical care time)  Medications Ordered in ED Medications  sulfamethoxazole-trimethoprim (BACTRIM DS,SEPTRA DS) 800-160 MG per tablet 1 tablet (has no administration in time range)  lidocaine (XYLOCAINE) 2 % viscous mouth solution 15 mL (15 mLs Mouth/Throat Given 02/23/18 2310)     Initial Impression / Assessment and Plan / ED Course  I have reviewed the triage vital signs and the nursing notes.  Pertinent labs & imaging results that were available during my care of the patient were reviewed by me and considered in my medical decision making (see chart for details).     Pt with acute cystitis per history with significant abnormal urine, culture pending.  Will tx due to sx at this time pending culture.  Bactrim, pyridium.  Offered pt pelvic exam given chronic vaginal dc. She denies risk factors for std.  Pt defers given chronicity and is also overdue for PAP. She was referred to [facility] to establish care. Doubt pyelo. Doubt renal stones.  Final Clinical Impressions(s) / ED Diagnoses   Final diagnoses:  Acute cystitis without hematuria    ED Discharge Orders        Ordered    sulfamethoxazole-trimethoprim (BACTRIM DS,SEPTRA DS) 800-160 MG tablet  2 times daily     02/23/18 2321    phenazopyridine (PYRIDIUM) 200 MG tablet  3 times daily     02/23/18 2323       Evalee Jefferson, PA-C 02/24/18 8469    Milton Ferguson, MD 02/24/18 [phone]

## 2018-02-23 NOTE — Discharge Instructions (Signed)
Take your entire course of your antibiotics prescribed.  Make sure you are drinking plenty of fluids.  Get rechecked for persistent symptoms or if you develop fevers, nausea or vomiting.

## 2018-02-23 NOTE — ED Triage Notes (Signed)
Pt is here for UTI. Pt is having urinary frequency, pain and burning with urination.  No abdominal pain. No fever or chills.

## 2018-02-25 LAB — URINE CULTURE: SPECIAL REQUESTS: NORMAL

## 2018-04-15 ENCOUNTER — Other Ambulatory Visit: Payer: Self-pay

## 2018-04-15 ENCOUNTER — Encounter: Payer: Self-pay | Admitting: Obstetrics and Gynecology

## 2018-04-15 ENCOUNTER — Other Ambulatory Visit (HOSPITAL_COMMUNITY)
Admission: RE | Admit: 2018-04-15 | Discharge: 2018-04-15 | Disposition: A | Discharge status: Home / Self Care | Payer: BLUE CROSS/BLUE SHIELD | Source: Ambulatory Visit | Attending: Obstetrics and Gynecology | Admitting: Obstetrics and Gynecology

## 2018-04-15 ENCOUNTER — Ambulatory Visit (INDEPENDENT_AMBULATORY_CARE_PROVIDER_SITE_OTHER): Payer: BLUE CROSS/BLUE SHIELD | Admitting: Obstetrics and Gynecology

## 2018-04-15 VITALS — BP 122/82 | HR 72 | Ht 63.0 in | Wt 148.0 lb

## 2018-04-15 DIAGNOSIS — Z124 Encounter for screening for malignant neoplasm of cervix: Secondary | ICD-10-CM

## 2018-04-15 DIAGNOSIS — Z01419 Encounter for gynecological examination (general) (routine) without abnormal findings: Secondary | ICD-10-CM

## 2018-04-15 DIAGNOSIS — N760 Acute vaginitis: Secondary | ICD-10-CM

## 2018-04-15 DIAGNOSIS — N898 Other specified noninflammatory disorders of vagina: Secondary | ICD-10-CM

## 2018-04-15 DIAGNOSIS — Z23 Encounter for immunization: Secondary | ICD-10-CM

## 2018-04-15 DIAGNOSIS — B9689 Other specified bacterial agents as the cause of diseases classified elsewhere: Secondary | ICD-10-CM

## 2018-04-15 DIAGNOSIS — N882 Stricture and stenosis of cervix uteri: Secondary | ICD-10-CM

## 2018-04-15 DIAGNOSIS — E559 Vitamin D deficiency, unspecified: Secondary | ICD-10-CM

## 2018-04-15 DIAGNOSIS — Z113 Encounter for screening for infections with a predominantly sexual mode of transmission: Secondary | ICD-10-CM

## 2018-04-15 DIAGNOSIS — Z30431 Encounter for routine checking of intrauterine contraceptive device: Secondary | ICD-10-CM

## 2018-04-15 DIAGNOSIS — Z0000 Encounter for general adult medical examination without abnormal findings: Secondary | ICD-10-CM

## 2018-04-15 DIAGNOSIS — Z1151 Encounter for screening for human papillomavirus (HPV): Secondary | ICD-10-CM | POA: Diagnosis not present

## 2018-04-15 DIAGNOSIS — Z3009 Encounter for other general counseling and advice on contraception: Secondary | ICD-10-CM

## 2018-04-15 MED ORDER — METRONIDAZOLE 500 MG PO TABS: 500.0000 mg | ORAL_TABLET | Freq: Two times a day (BID) | ORAL | 0 refills | Status: DC

## 2018-04-15 MED ORDER — MISOPROSTOL 200 MCG PO TABS: ORAL_TABLET | ORAL | 0 refills | Status: DC

## 2018-04-15 NOTE — Progress Notes (Signed)
47 y.o. [id number] SingleAfrican AmericanF here for annual exam and vaginal discharge  She has a ? paragard IUD, youngest child is 37, thinks it was placed a couple of years after he was born. Cycles are monthly x 3 day, light. No BTB. No cramps. Sexually active, same partner for 9 months. Not using condoms. No dyspareunia.  She c/o a vaginal discharge, has had it for years. Watery,creamy, white, mucous, some odor. She has had negative vaginitis evaluation in the past. The discharge really bothers her. No itching or burning. She has occasional irritation.  No bowel or bladder c/o.     Patient's last menstrual period was 04/11/2018.          Sexually active: Yes.    The current method of family planning is IUD.    Exercising: No.  n/a Smoker:  Yes, smokes <1PPD, trying to quit. There is a program she can join through work.   Health Maintenance: Pap:  unknown History of abnormal Pap:  unsure MMG:  unsure Colonoscopy:  never BMD:   never TDaP:  unknown Gardasil: no   reports that she has been smoking cigarettes. She has been smoking about 1.00 pack per day. She has never used smokeless tobacco. She reports that she drinks alcohol. She reports that she has current or past drug history. She works in [employer]. Works long hours, 12 hour night shifts. Kids are all grown, 4 grand kids (59-30 years old)  History reviewed. No pertinent past medical history.  History reviewed. No pertinent surgical history.  Current Outpatient Medications  Medication Sig Dispense Refill  . ASPIRIN PO Take 1 tablet by mouth once as needed (for pain).     No current facility-administered medications for this visit.     Family History  Problem Relation Age of Onset  . Asthma Mother   . Cancer Father   Father had bone cancer  Review of Systems  Constitutional: Negative.   HENT: Negative.   Eyes: Negative.   Respiratory: Negative.   Cardiovascular: Negative.   Gastrointestinal: Negative.    Endocrine: Negative.   Genitourinary: Negative.        Vaginal discharge with odor  Musculoskeletal: Negative.   Skin: Negative.   Allergic/Immunologic: Negative.   Neurological: Negative.   Hematological: Negative.   Psychiatric/Behavioral: Negative.   All other systems reviewed and are negative.   Exam:   BP 122/82   Pulse 72   Ht 5\' 3"  (1.6 m)   Wt 148 lb (67.1 kg)   LMP 04/11/2018 Comment: IUD  BMI 26.22 kg/m   Weight change: @WEIGHTCHANGE @ Height:   Height: 5\' 3"  (160 cm)  Ht Readings from Last 3 Encounters:  04/15/18 5\' 3"  (1.6 m)  02/23/18 5\' 5"  (1.651 m)  09/28/16 5\' 4"  (1.626 m)    General appearance: alert, cooperative and appears stated age Head: Normocephalic, without obvious abnormality, atraumatic Neck: no adenopathy, supple, symmetrical, trachea midline and thyroid normal to inspection and palpation Lungs: clear to auscultation bilaterally Cardiovascular: regular rate and rhythm Breasts: normal appearance, no masses or tenderness Abdomen: soft, non-tender; non distended,  no masses,  no organomegaly Extremities: extremities normal, atraumatic, no cyanosis or edema Skin: Skin color, texture, turgor normal. No rashes or lesions Lymph nodes: Cervical, supraclavicular, and axillary nodes normal. No abnormal inguinal nodes palpated Neurologic: Grossly normal   Pelvic: External genitalia:  no lesions              Urethra:  normal appearing urethra with no masses, tenderness  or lesions              Bartholins and Skenes: normal                 Vagina: normal appearing vagina with normal color. She has an  Increase in yellow, frothy dishcarge.    She has a very short perineum. Minimal tissue between her vagina and rectum (patient denies issues with fecal incontinence)              Cervix: no lesions and IUD string seen, cervix tight               Bimanual Exam:  Uterus:  normal size, contour, position, consistency, mobility, non-tender and anteverted               Adnexa: no mass, fullness, tenderness               Rectovaginal: Confirms               Anus:  Decreased sphincter tone, no lesions. Very thin tissue between the vagina and rectum  Chaperone was present for exam.  Wet prep: + clue, no trich, few wbc KOH: no yeast PH: 5   A:  Well Woman with normal exam  Contraception, interested in another IUD.  Vit d def (in diet)  Bacterial vaginitis  P:   Pap with hpv  Screening STD  Screening labs  Return for removal IUD removal and reinsertion, information given on the paragard and the mirena. If she has a paragard now, she thinks she wants another paragard, if not she wants the mirena. She will consider her options some more  Will pre-treat with cytotec for cervical stenosis  Discussed breast self exam  Discussed calcium and vit D intake  BV treated with flagyl, no ETOH with flagyl

## 2018-04-16 ENCOUNTER — Telehealth: Payer: Self-pay

## 2018-04-16 LAB — COMPREHENSIVE METABOLIC PANEL
ALK PHOS: 85 IU/L (ref 39–117)
ALT: 10 IU/L (ref 0–32)
AST: 12 IU/L (ref 0–40)
Albumin/Globulin Ratio: 1.3 (ref 1.2–2.2)
Albumin: 3.3 g/dL — ABNORMAL LOW (ref 3.5–5.5)
BUN/Creatinine Ratio: 13 (ref 9–23)
BUN: 9 mg/dL (ref 6–24)
Bilirubin Total: 0.2 mg/dL (ref 0.0–1.2)
CALCIUM: 8.5 mg/dL — AB (ref 8.7–10.2)
CO2: 22 mmol/L (ref 20–29)
CREATININE: 0.72 mg/dL (ref 0.57–1.00)
Chloride: 104 mmol/L (ref 96–106)
GFR calc Af Amer: 117 mL/min/{1.73_m2} (ref >59)
GFR, EST NON AFRICAN AMERICAN: 101 mL/min/{1.73_m2} (ref >59)
Globulin, Total: 2.5 g/dL (ref 1.5–4.5)
Glucose: 80 mg/dL (ref 65–99)
POTASSIUM: 3.9 mmol/L (ref 3.5–5.2)
Sodium: 142 mmol/L (ref 134–144)
Total Protein: 5.8 g/dL — ABNORMAL LOW (ref 6.0–8.5)

## 2018-04-16 LAB — CBC
Hematocrit: 42.9 % (ref 34.0–46.6)
Hemoglobin: 14.4 g/dL (ref 11.1–15.9)
MCH: 29.2 pg (ref 26.6–33.0)
MCHC: 33.6 g/dL (ref 31.5–35.7)
MCV: 87 fL (ref 79–97)
PLATELETS: 256 10*3/uL (ref 150–450)
RBC: 4.93 x10E6/uL (ref 3.77–5.28)
RDW: 14.5 % (ref 12.3–15.4)
WBC: 5.6 10*3/uL (ref 3.4–10.8)

## 2018-04-16 LAB — HEP, RPR, HIV PANEL
HIV SCREEN 4TH GENERATION: NONREACTIVE
Hepatitis B Surface Ag: NEGATIVE
RPR Ser Ql: NONREACTIVE

## 2018-04-16 LAB — LIPID PANEL
CHOL/HDL RATIO: 2.8 ratio (ref 0.0–4.4)
Cholesterol, Total: 104 mg/dL (ref 100–199)
HDL: 37 mg/dL — AB (ref >39)
LDL CALC: 57 mg/dL (ref 0–99)
TRIGLYCERIDES: 51 mg/dL (ref 0–149)
VLDL Cholesterol Cal: 10 mg/dL (ref 5–40)

## 2018-04-16 LAB — VITAMIN D 25 HYDROXY (VIT D DEFICIENCY, FRACTURES): Vit D, 25-Hydroxy: 10.3 ng/mL — ABNORMAL LOW (ref 30.0–100.0)

## 2018-04-16 LAB — HEPATITIS C ANTIBODY: Hep C Virus Ab: <0.1 s/co ratio (ref 0.0–0.9)

## 2018-04-16 MED ORDER — VITAMIN D (ERGOCALCIFEROL) 1.25 MG (50000 UNIT) PO CAPS: 50000.0000 [IU] | ORAL_CAPSULE | ORAL | 0 refills | Status: DC

## 2018-04-16 NOTE — Telephone Encounter (Signed)
-----   Message from Salvadore Dom, MD sent at 04/16/2018  9:06 AM EDT ----- The patient's corrected calcium is 9.3, normal. Her vit d is very low. Please call in 50,000 IU of vit d 3 weekly for 12 weeks, then set her up for another vit d level. Her HDL is low, which can increase her risk of heart disease, but the rest of her lipid panel is normal. She should eat a healthy diet and exercise regularly and have a f/u lipid panel in one year.  The rest of her blood work is normal. Her pap and cervical cultures are pending.

## 2018-04-16 NOTE — Telephone Encounter (Signed)
Attempted to reach patient, left message for her to call.  RX for Vitamin D3 sent to pharmacy.  Follow up lab appointment also made.

## 2018-04-17 ENCOUNTER — Telehealth: Payer: Self-pay | Admitting: Emergency Medicine

## 2018-04-17 LAB — CYTOLOGY - PAP
Chlamydia: NEGATIVE
DIAGNOSIS: NEGATIVE
HPV (WINDOPATH): NOT DETECTED
Neisseria Gonorrhea: NEGATIVE
Trichomonas: POSITIVE — AB

## 2018-04-17 NOTE — Telephone Encounter (Signed)
-----   Message from Salvadore Dom, MD sent at 04/17/2018  2:35 PM EDT ----- Please call the patient (see lab note from 04/16/18) Please let her know that her pap returned as normal, but she has trichomonas vaginitis in addition to the [place] she was diagnosed with the other day. Please inform her that Dalbert Batman is an STD. She is already on flagyl BID for 7 days which should treat the trich and BV. She needs a repeat test for trich 2 weeks after finishing the flagyl. She should not be sexually active for at least a week after both she and her partner have been treated and should use condoms. Please offer expedited partner treatment with a single 2 gram oral flagyl dose.  If she has been sexually active since starting her flagyl, she should restart the 7 days of treatment (ie call in an extra few tablets) Please add GC/CT testing onto her pap (I thought I did this). Her pap, HPV and other std testing was negative.  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 She is planning removal of her current IUD and placement of a new IUD. I would recommend that she return next week for removal of her IUD and that we don't place another one until her f/u testing for Trich is negative.  She should use condoms or abstain between now and then.

## 2018-04-17 NOTE — Telephone Encounter (Signed)
Call to patient. 15 minutes via phone with her discussing plan of care.  Patient has not started Flagyl but aware she needs to and will pick up Rx today and abstain from intercourse.  Questions answered regarding possible exposure and timing of exposure. Expedited partner therapy offered and pt declines at this time.  Declines to schedule IUD removal at this time. She will review her schedule for next week and return call.  Add on request to [facility] for Gonorrhea and Chlamydia testing faxed at this time.  Encounter closed.

## 2018-04-19 NOTE — Progress Notes (Deleted)
GYNECOLOGY  VISIT   HPI: 47 y.o.   Single  African American  female   [phone] with Patient's last menstrual period was 04/11/2018.   here for IUD removal     GYNECOLOGIC HISTORY: Patient's last menstrual period was 04/11/2018. Contraception: IUD Menopausal hormone therapy: none        OB History    Gravida  4   Para  4   Term  3   Preterm  0   AB  0   Living  4     SAB  0   TAB  0   Ectopic  0   Multiple  0   Live Births  4              There are no active problems to display for this patient.   No past medical history on file.  No past surgical history on file.  Current Outpatient Medications  Medication Sig Dispense Refill  . ASPIRIN PO Take 1 tablet by mouth once as needed (for pain).    . metroNIDAZOLE (FLAGYL) 500 MG tablet Take 1 tablet (500 mg total) by mouth 2 (two) times daily. 14 tablet 0  . misoprostol (CYTOTEC) 200 MCG tablet Place 2 tablets vaginally 6-12 hours prior to IUD insertion 2 tablet 0  . Vitamin D, Ergocalciferol, (DRISDOL) 50000 units CAPS capsule Take 1 capsule (50,000 Units total) by mouth every 7 (seven) days. 12 capsule 0   No current facility-administered medications for this visit.      ALLERGIES: Patient has no known allergies.  Family History  Problem Relation Age of Onset  . Asthma Mother   . Cancer Father     Social History   Socioeconomic History  . Marital status: Single    Spouse name: Not on file  . Number of children: Not on file  . Years of education: Not on file  . Highest education level: Not on file  Occupational History  . Not on file  Social Needs  . Financial resource strain: Not on file  . Food insecurity:    Worry: Not on file    Inability: Not on file  . Transportation needs:    Medical: Not on file    Non-medical: Not on file  Tobacco Use  . Smoking status: Current Every Day Smoker    Packs/day: 1.00    Types: Cigarettes  . Smokeless tobacco: Never Used  Substance and Sexual  Activity  . Alcohol use: Yes  . Drug use: Not Currently  . Sexual activity: Yes  Lifestyle  . Physical activity:    Days per week: Not on file    Minutes per session: Not on file  . Stress: Not on file  Relationships  . Social connections:    Talks on phone: Not on file    Gets together: Not on file    Attends religious service: Not on file    Active member of club or organization: Not on file    Attends meetings of clubs or organizations: Not on file    Relationship status: Not on file  . Intimate partner violence:    Fear of current or ex partner: Not on file    Emotionally abused: Not on file    Physically abused: Not on file    Forced sexual activity: Not on file  Other Topics Concern  . Not on file  Social History Narrative  . Not on file    Review of Systems  Constitutional: Negative.  HENT: Negative.   Eyes: Negative.   Respiratory: Negative.   Cardiovascular: Negative.   Gastrointestinal: Negative.   Genitourinary: Negative.   Musculoskeletal: Negative.   Skin: Negative.   Neurological: Negative.   Endo/Heme/Allergies: Negative.   Psychiatric/Behavioral: Negative.   All other systems reviewed and are negative.   PHYSICAL EXAMINATION:    LMP 04/11/2018 Comment: IUD    General appearance: alert, cooperative and appears stated age Neck: no adenopathy, supple, symmetrical, trachea midline and thyroid {CHL AMB PHY EX THYROID NORM DEFAULT:[phone]::"normal to inspection and palpation"} Breasts: {Exam; breast:13139::"normal appearance, no masses or tenderness"} Abdomen: soft, non-tender; non distended, no masses,  no organomegaly  Pelvic: External genitalia:  no lesions              Urethra:  normal appearing urethra with no masses, tenderness or lesions              Bartholins and Skenes: normal                 Vagina: normal appearing vagina with normal color and discharge, no lesions              Cervix: {CHL AMB PHY EX CERVIX NORM  DEFAULT:[phone]::"no lesions"}              Bimanual Exam:  Uterus:  {CHL AMB PHY EX UTERUS NORM [phone]::"normal size, contour, position, consistency, mobility, non-tender"}              Adnexa: {CHL AMB PHY EX ADNEXA NO MASS DEFAULT:[phone]::"no mass, fullness, tenderness"}              Rectovaginal: {yes no:[id number]}.  Confirms.              Anus:  normal sphincter tone, no lesions  Chaperone was present for exam.  ASSESSMENT     PLAN    An After Visit Summary was printed and given to the patient.  *** minutes face to face time of which over 50% was spent in counseling.

## 2018-04-23 NOTE — Telephone Encounter (Signed)
Cytotec is for the insertion only. I'm removing the IUD separately from insertion secondary to the actinomyces.

## 2018-04-23 NOTE — Telephone Encounter (Signed)
Call to patient and detailed message left to advise no medication needed prior to appointment tomorrow for IUD removal only.  Call back with any further questions.  Detailed message okay per designated party release form.  Encounter closed.

## 2018-04-23 NOTE — Telephone Encounter (Signed)
Dr. Talbert Nan, Pt having IUD removal only due to actinomyces and + trich.  Needs cytotec for insertion only?

## 2018-04-23 NOTE — Telephone Encounter (Signed)
Patient has some questions regarding the prescription she is to take before her IUD removal tomorrow.

## 2018-04-24 ENCOUNTER — Telehealth: Payer: Self-pay | Admitting: Obstetrics and Gynecology

## 2018-04-24 ENCOUNTER — Ambulatory Visit: Payer: BLUE CROSS/BLUE SHIELD | Admitting: Obstetrics and Gynecology

## 2018-04-24 ENCOUNTER — Encounter: Payer: Self-pay | Admitting: Obstetrics and Gynecology

## 2018-04-24 NOTE — Telephone Encounter (Signed)
Returned call. She will check her schedule with her daughter and call back to schedule IUD removal.

## 2018-04-24 NOTE — Telephone Encounter (Signed)
Patient Marcia Wu her IUD removal appointment today. I left a message for the patient to call and reschedule. [facility] policy followed.

## 2018-04-24 NOTE — Telephone Encounter (Signed)
Patient arrived to the office at 1:30 for 1:00 procedure. She was waiting on daughter to pick her up. Would like for nurse to call to get rescheduled.

## 2018-05-02 NOTE — Telephone Encounter (Signed)
Return call to patient.  Accepts appointment for IUD removal on 05/06/2018. Encouraged patient to arrive with plenty of time for check in.  Advised no cytotec needed as iud removal only.  Discussed again, plans to have IUD removed then allow for time to for iud reinsertion after negative cultures.  Pt agreeable to plan.  Encounter closed.

## 2018-05-02 NOTE — Progress Notes (Deleted)
GYNECOLOGY  VISIT   HPI: 47 y.o.   Single Black or African American Not Hispanic or Latino  female   [phone] with Patient's last menstrual period was 04/11/2018.   here for IUD removal and test of cure for trich.   GYNECOLOGIC HISTORY: Patient's last menstrual period was 04/11/2018. Contraception:IUD Menopausal hormone therapy: none        OB History    Gravida  4   Para  4   Term  3   Preterm  0   AB  0   Living  4     SAB  0   TAB  0   Ectopic  0   Multiple  0   Live Births  4              There are no active problems to display for this patient.   No past medical history on file.  No past surgical history on file.  Current Outpatient Medications  Medication Sig Dispense Refill  . ASPIRIN PO Take 1 tablet by mouth once as needed (for pain).    . metroNIDAZOLE (FLAGYL) 500 MG tablet Take 1 tablet (500 mg total) by mouth 2 (two) times daily. 14 tablet 0  . misoprostol (CYTOTEC) 200 MCG tablet Place 2 tablets vaginally 6-12 hours prior to IUD insertion 2 tablet 0  . Vitamin D, Ergocalciferol, (DRISDOL) 50000 units CAPS capsule Take 1 capsule (50,000 Units total) by mouth every 7 (seven) days. 12 capsule 0   No current facility-administered medications for this visit.      ALLERGIES: Patient has no known allergies.  Family History  Problem Relation Age of Onset  . Asthma Mother   . Cancer Father     Social History   Socioeconomic History  . Marital status: Single    Spouse name: Not on file  . Number of children: Not on file  . Years of education: Not on file  . Highest education level: Not on file  Occupational History  . Not on file  Social Needs  . Financial resource strain: Not on file  . Food insecurity:    Worry: Not on file    Inability: Not on file  . Transportation needs:    Medical: Not on file    Non-medical: Not on file  Tobacco Use  . Smoking status: Current Every Day Smoker    Packs/day: 1.00    Types: Cigarettes  .  Smokeless tobacco: Never Used  Substance and Sexual Activity  . Alcohol use: Yes  . Drug use: Not Currently  . Sexual activity: Yes  Lifestyle  . Physical activity:    Days per week: Not on file    Minutes per session: Not on file  . Stress: Not on file  Relationships  . Social connections:    Talks on phone: Not on file    Gets together: Not on file    Attends religious service: Not on file    Active member of club or organization: Not on file    Attends meetings of clubs or organizations: Not on file    Relationship status: Not on file  . Intimate partner violence:    Fear of current or ex partner: Not on file    Emotionally abused: Not on file    Physically abused: Not on file    Forced sexual activity: Not on file  Other Topics Concern  . Not on file  Social History Narrative  . Not on file  Review of Systems  Constitutional: Negative.   HENT: Negative.   Eyes: Negative.   Respiratory: Negative.   Cardiovascular: Negative.   Gastrointestinal: Negative.   Endocrine: Negative.   Genitourinary: Negative.   Musculoskeletal: Negative.   Skin: Negative.   Allergic/Immunologic: Negative.   Neurological: Negative.   Hematological: Negative.   Psychiatric/Behavioral: Negative.   All other systems reviewed and are negative.   PHYSICAL EXAMINATION:    LMP 04/11/2018 Comment: IUD    General appearance: alert, cooperative and appears stated age Neck: no adenopathy, supple, symmetrical, trachea midline and thyroid {CHL AMB PHY EX THYROID NORM [phone]::"normal to inspection and palpation"} Breasts: {Exam; breast:13139::"normal appearance, no masses or tenderness"} Abdomen: soft, non-tender; non distended, no masses,  no organomegaly  Pelvic: External genitalia:  no lesions              Urethra:  normal appearing urethra with no masses, tenderness or lesions              Bartholins and Skenes: normal                 Vagina: normal appearing vagina with  normal color and discharge, no lesions              Cervix: {CHL AMB PHY EX CERVIX NORM DEFAULT:[phone]::"no lesions"}              Bimanual Exam:  Uterus:  {CHL AMB PHY EX UTERUS NORM DEFAULT:[phone]::"normal size, contour, position, consistency, mobility, non-tender"}              Adnexa: {CHL AMB PHY EX ADNEXA NO MASS [phone]::"no mass, fullness, tenderness"}              Rectovaginal: {yes no:[id number]}.  Confirms.              Anus:  normal sphincter tone, no lesions  Chaperone was present for exam.  ASSESSMENT     PLAN    An After Visit Summary was printed and given to the patient.  *** minutes face to face time of which over 50% was spent in counseling.

## 2018-05-06 ENCOUNTER — Telehealth: Payer: Self-pay | Admitting: Obstetrics and Gynecology

## 2018-05-06 ENCOUNTER — Ambulatory Visit: Payer: Self-pay | Admitting: Obstetrics and Gynecology

## 2018-05-06 NOTE — Telephone Encounter (Signed)
Patient called and cancelled her procedure appointment for IUD removal/trich TOC today due to oversleeping and is still on her cycle. Routing to triage for rescheduling.  Cc: Dr. Talbert Nan, Deloris Ping, [place]

## 2018-05-06 NOTE — Telephone Encounter (Signed)
Left message to call Saya Mccoll at [phone].  

## 2018-05-15 NOTE — Telephone Encounter (Signed)
Call placed [phone], left message advising to return call to [place], [place] at [facility] at [phone].  Attempted alternative number, no answer, voicemail not set up.   CAN SPEAK WITH ANY AVAILABLE NURSE

## 2018-05-17 NOTE — Telephone Encounter (Signed)
Patient returned call

## 2018-05-20 ENCOUNTER — Telehealth: Payer: Self-pay | Admitting: Obstetrics and Gynecology

## 2018-05-20 ENCOUNTER — Ambulatory Visit (INDEPENDENT_AMBULATORY_CARE_PROVIDER_SITE_OTHER): Payer: BLUE CROSS/BLUE SHIELD | Admitting: Obstetrics and Gynecology

## 2018-05-20 ENCOUNTER — Encounter: Payer: Self-pay | Admitting: Obstetrics and Gynecology

## 2018-05-20 ENCOUNTER — Other Ambulatory Visit: Payer: Self-pay

## 2018-05-20 VITALS — BP 108/68 | HR 64 | Wt 154.6 lb

## 2018-05-20 DIAGNOSIS — Z8619 Personal history of other infectious and parasitic diseases: Secondary | ICD-10-CM | POA: Diagnosis not present

## 2018-05-20 DIAGNOSIS — Z30432 Encounter for removal of intrauterine contraceptive device: Secondary | ICD-10-CM

## 2018-05-20 NOTE — Progress Notes (Signed)
GYNECOLOGY  VISIT   HPI: 47 y.o.   Single Black or African American Not Hispanic or Latino  female   [phone] with Patient's last menstrual period was 05/06/2018 (approximate).   here for Paragard IUD removal and trich recheck. The patient has an IUD that is overdue for removal. She was also diagnosed and treated for trich vaginitis last month and is due for a recheck (other STD testing was negative). She also had actinomyces on her pap, will pull her IUD and return for replacement of another IUD in a few weeks.  On questioning she isn't sure if her partner was treated for Trich, she has been sexually active with him since then, used condoms.   GYNECOLOGIC HISTORY: Patient's last menstrual period was 05/06/2018 (approximate). Contraception:None Menopausal hormone therapy: None        OB History    Gravida  4   Para  4   Term  3   Preterm  0   AB  0   Living  4     SAB  0   TAB  0   Ectopic  0   Multiple  0   Live Births  4              There are no active problems to display for this patient.   Past Medical History:  Diagnosis Date  . STD (sexually transmitted disease)     History reviewed. No pertinent surgical history.  Current Outpatient Medications  Medication Sig Dispense Refill  . ASPIRIN PO Take 1 tablet by mouth once as needed (for pain).    . Vitamin D, Ergocalciferol, (DRISDOL) 50000 units CAPS capsule Take 1 capsule (50,000 Units total) by mouth every 7 (seven) days. 12 capsule 0  . misoprostol (CYTOTEC) 200 MCG tablet Place 2 tablets vaginally 6-12 hours prior to IUD insertion (Patient not taking: Reported on 05/20/2018) 2 tablet 0   No current facility-administered medications for this visit.      ALLERGIES: Patient has no known allergies.  Family History  Problem Relation Age of Onset  . Asthma Mother   . Cancer Father     Social History   Socioeconomic History  . Marital status: Single    Spouse name: Not on file  . Number of  children: Not on file  . Years of education: Not on file  . Highest education level: Not on file  Occupational History  . Not on file  Social Needs  . Financial resource strain: Not on file  . Food insecurity:    Worry: Not on file    Inability: Not on file  . Transportation needs:    Medical: Not on file    Non-medical: Not on file  Tobacco Use  . Smoking status: Current Every Day Smoker    Packs/day: 1.00    Types: Cigarettes  . Smokeless tobacco: Never Used  Substance and Sexual Activity  . Alcohol use: Yes  . Drug use: Not Currently  . Sexual activity: Yes    Birth control/protection: None  Lifestyle  . Physical activity:    Days per week: Not on file    Minutes per session: Not on file  . Stress: Not on file  Relationships  . Social connections:    Talks on phone: Not on file    Gets together: Not on file    Attends religious service: Not on file    Active member of club or organization: Not on file    Attends  meetings of clubs or organizations: Not on file    Relationship status: Not on file  . Intimate partner violence:    Fear of current or ex partner: Not on file    Emotionally abused: Not on file    Physically abused: Not on file    Forced sexual activity: Not on file  Other Topics Concern  . Not on file  Social History Narrative  . Not on file    Review of Systems  Constitutional: Negative.   HENT: Negative.   Eyes: Negative.   Respiratory: Negative.   Cardiovascular: Negative.   Gastrointestinal: Negative.   Endocrine: Negative.   Genitourinary: Negative.   Musculoskeletal: Negative.   Skin: Negative.   Allergic/Immunologic: Negative.   Neurological: Negative.   Hematological: Negative.   Psychiatric/Behavioral: Negative.     PHYSICAL EXAMINATION:    BP 108/68 (BP Location: Right Arm, Patient Position: Sitting, Cuff Size: Normal)   Pulse 64   Wt 154 lb 9.6 oz (70.1 kg)   LMP 05/06/2018 (Approximate)   BMI 27.39 kg/m     General  appearance: alert, cooperative and appears stated age  Pelvic: External genitalia:  no lesions              Urethra:  normal appearing urethra with no masses, tenderness or lesions              Bartholins and Skenes: normal                 Vagina: normal appearing vagina with slight increase in yellow vaginal d/c, + odor              Cervix: no lesions and stenotic, IUD string 4 cm. IUD removed with ringed forceps, tight removal. Copper IUD               Chaperone was present for exam.  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 IUD removed    PLAN Affirm sent She will check with her partner if he was treated or not. If not, I would treat both of them irregardless of her affirm Otherwise treat based on results of affirm She wants to return for IUD insertion, would wait for her next cycle, pre-treat with cytotoec, and make sure STD testing is negative prior to insertion   An After Visit Summary was printed and given to the patient.

## 2018-05-20 NOTE — Telephone Encounter (Signed)
Spoke with patient. She has transportation to [facility] today. Asking to have appointment for iud removal.  Spoke with Lamont Snowball, okay for appointment today for IUD removal.  Hx actinomyces on pap, trich recently treated.  Appointment scheduled.  IUD has been precerted.  Encounter closed.

## 2018-05-20 NOTE — Telephone Encounter (Signed)
Patient calling to schedule IUD removal.

## 2018-05-20 NOTE — Addendum Note (Signed)
Addended by: Dorothy Spark on: 05/20/2018 04:51 PM   Modules accepted: Orders

## 2018-05-21 LAB — VAGINITIS/VAGINOSIS, DNA PROBE
Candida Species: NEGATIVE
Gardnerella vaginalis: POSITIVE — AB
TRICHOMONAS VAG: NEGATIVE

## 2018-05-22 ENCOUNTER — Telehealth: Payer: Self-pay | Admitting: *Deleted

## 2018-05-22 MED ORDER — METRONIDAZOLE 500 MG PO TABS: 500.0000 mg | ORAL_TABLET | Freq: Two times a day (BID) | ORAL | 0 refills | Status: DC

## 2018-05-22 NOTE — Telephone Encounter (Signed)
Notes recorded by Burnice Logan, RN on 05/22/2018 at 10:10 AM EDT Left message to call Sharee Pimple at [phone].

## 2018-05-22 NOTE — Telephone Encounter (Signed)
Spoke with patient, advised of results as seen below per Dr. Talbert Nan. Patient has not spoken with partner yet, will discuss and return call to office if EPT is needed. Advised patient EPT is a written RX that will need to be picked up from office. Patient states she would like the paragard IUD again. Order previously placed for IUD insertion and precerted. RX for flagyl po to verified pharmacy, ETOH precautions reviewed. Patient verbalizes understanding and is agreeable.   Routing to provider FYI. Patient is agreeable to disposition. Will close encounter.

## 2018-05-22 NOTE — Telephone Encounter (Signed)
-----   Message from Salvadore Dom, MD sent at 05/21/2018  5:57 PM EDT ----- Please tell the patient that she is + for BV, the testing for trich is negative. She wasn't sure her partner was treated for trich. Please call her and find out (she was going to ask him). If not please give him expedited partner treatment for trich, 2 grams of flagyl po x 1. I'm inclined to treat her BV, particularly given possible reexposure to trich and plans for IUD insertion. Please treat her with flagyl 500 mg BID x 7 days. No sex until one week after they have both completed treatment, then she needs to use condoms. She is to call with her cycle to schedule IUD insertion. See if she has decided which IUD she wants. The IUD I just removed with a copper IUD. The mirena will give her lighter to no cycles.

## 2018-05-30 ENCOUNTER — Telehealth: Payer: Self-pay | Admitting: Obstetrics and Gynecology

## 2018-05-30 ENCOUNTER — Other Ambulatory Visit: Payer: Self-pay | Admitting: *Deleted

## 2018-05-30 DIAGNOSIS — Z3009 Encounter for other general counseling and advice on contraception: Secondary | ICD-10-CM

## 2018-05-30 NOTE — Telephone Encounter (Signed)
Spoke with patient. LMP 05/29/18. Patient request to proceed with scheduling paragard IUD insertion. Patient has 2 doses of Flagyl left for tx of BV, started medication late. Instructed patient to complete medication.   IUD insertion scheduled for 10/14 at 3:30pm with Dr. Talbert Nan. Advised to take Motrin 800 mg with food and water one hour before procedure.  Routing to provider for final review. Patient is agreeable to disposition. Will close encounter.  Cc: Lerry Liner, Magdalene Patricia

## 2018-05-30 NOTE — Telephone Encounter (Signed)
Patient's cycle came on yesterday and she's calling to schedule iud insertion.

## 2018-06-03 ENCOUNTER — Ambulatory Visit: Payer: Self-pay

## 2018-06-03 ENCOUNTER — Telehealth: Payer: Self-pay | Admitting: Obstetrics and Gynecology

## 2018-06-03 ENCOUNTER — Ambulatory Visit (INDEPENDENT_AMBULATORY_CARE_PROVIDER_SITE_OTHER): Payer: BLUE CROSS/BLUE SHIELD | Admitting: Obstetrics and Gynecology

## 2018-06-03 ENCOUNTER — Other Ambulatory Visit: Payer: Self-pay

## 2018-06-03 ENCOUNTER — Encounter: Payer: Self-pay | Admitting: Obstetrics and Gynecology

## 2018-06-03 VITALS — BP 108/66 | HR 60 | Wt 157.2 lb

## 2018-06-03 DIAGNOSIS — Z30430 Encounter for insertion of intrauterine contraceptive device: Secondary | ICD-10-CM

## 2018-06-03 DIAGNOSIS — N882 Stricture and stenosis of cervix uteri: Secondary | ICD-10-CM

## 2018-06-03 DIAGNOSIS — Z01812 Encounter for preprocedural laboratory examination: Secondary | ICD-10-CM

## 2018-06-03 DIAGNOSIS — Z3009 Encounter for other general counseling and advice on contraception: Secondary | ICD-10-CM

## 2018-06-03 LAB — POCT URINE PREGNANCY: Preg Test, Ur: NEGATIVE

## 2018-06-03 NOTE — Telephone Encounter (Signed)
Reviewed with Dr. Nelson Chimes. OK to proceed with IUD insertion as scheduled if patient is not symptomatic for BV.   Call returned to patient. Patient denies vaginal d/c, odor, or pain. Advised to keep appt as scheduled today for IUD insertion. Patient verbalizes understanding and is agreeable.   Encounter closed.

## 2018-06-03 NOTE — Patient Instructions (Signed)

## 2018-06-03 NOTE — Telephone Encounter (Signed)
Spoke with patient. Patient states she has 2 days of Flagyl Rx remaining. Patient states she was guessing how much medication she had left when we spoke on 10/10. Patient asking if ok to proceed with IUD insertion as scheduled today? Or reschedule?  Advised will review with Dr. Talbert Nan and return call.

## 2018-06-03 NOTE — Progress Notes (Signed)
GYNECOLOGY  VISIT   HPI: 47 y.o.   Single Black or African American Not Hispanic or Latino  female   [phone] with Patient's last menstrual period was 05/29/2018 (exact date).   here for Paragard IUD insertion.     GYNECOLOGIC HISTORY: Patient's last menstrual period was 05/29/2018 (exact date). Contraception: None Menopausal hormone therapy: None        OB History    Gravida  4   Para  4   Term  3   Preterm  0   AB  0   Living  4     SAB  0   TAB  0   Ectopic  0   Multiple  0   Live Births  4              There are no active problems to display for this patient.   Past Medical History:  Diagnosis Date  . STD (sexually transmitted disease)     History reviewed. No pertinent surgical history.  Current Outpatient Medications  Medication Sig Dispense Refill  . ASPIRIN PO Take 1 tablet by mouth once as needed (for pain).    . metroNIDAZOLE (FLAGYL) 500 MG tablet Take 1 tablet (500 mg total) by mouth 2 (two) times daily. 14 tablet 0  . Vitamin D, Ergocalciferol, (DRISDOL) 50000 units CAPS capsule Take 1 capsule (50,000 Units total) by mouth every 7 (seven) days. 12 capsule 0   No current facility-administered medications for this visit.      ALLERGIES: Patient has no known allergies.  Family History  Problem Relation Age of Onset  . Asthma Mother   . Cancer Father     Social History   Socioeconomic History  . Marital status: Single    Spouse name: Not on file  . Number of children: Not on file  . Years of education: Not on file  . Highest education level: Not on file  Occupational History  . Not on file  Social Needs  . Financial resource strain: Not on file  . Food insecurity:    Worry: Not on file    Inability: Not on file  . Transportation needs:    Medical: Not on file    Non-medical: Not on file  Tobacco Use  . Smoking status: Current Every Day Smoker    Packs/day: 1.00    Types: Cigarettes  . Smokeless tobacco: Never Used   Substance and Sexual Activity  . Alcohol use: Yes  . Drug use: Not Currently  . Sexual activity: Yes    Birth control/protection: None  Lifestyle  . Physical activity:    Days per week: Not on file    Minutes per session: Not on file  . Stress: Not on file  Relationships  . Social connections:    Talks on phone: Not on file    Gets together: Not on file    Attends religious service: Not on file    Active member of club or organization: Not on file    Attends meetings of clubs or organizations: Not on file    Relationship status: Not on file  . Intimate partner violence:    Fear of current or ex partner: Not on file    Emotionally abused: Not on file    Physically abused: Not on file    Forced sexual activity: Not on file  Other Topics Concern  . Not on file  Social History Narrative  . Not on file    Review  of Systems  Constitutional: Negative.   HENT: Negative.   Eyes: Negative.   Respiratory: Negative.   Cardiovascular: Negative.   Gastrointestinal: Negative.   Genitourinary: Negative.   Musculoskeletal: Negative.   Skin: Negative.   Neurological: Negative.   Endo/Heme/Allergies: Negative.   Psychiatric/Behavioral: Negative.     PHYSICAL EXAMINATION:    BP 108/66 (BP Location: Right Arm, Patient Position: Sitting, Cuff Size: Normal)   Pulse 60   Wt 157 lb 3.2 oz (71.3 kg)   LMP 05/29/2018 (Exact Date)   BMI 27.85 kg/m     General appearance: alert, cooperative and appears stated age  Pelvic: External genitalia:  no lesions              Urethra:  normal appearing urethra with no masses, tenderness or lesions              Bartholins and Skenes: normal                 Vagina: normal appearing vagina with normal color and discharge, no lesions              Cervix: no cervical motion tenderness, no lesions and stenotic              Bimanual Exam:  Uterus:  normal size, contour, position, consistency, mobility, non-tender and anteverted              Adnexa:  no mass, fullness, tenderness                The risks of the Paragard IUD were reviewed with the patient, including infection, abnormal bleeding and uterine perfortion. Consent was signed.  A speculum was placed in the vagina, the cervix was cleansed with betadine. A tenaculum was placed on the cervix, the cervix was stenotic and was dilated with the mini-dilators to the #5 hagar dilator. The uterus sounded to 6-7 cm.  The Paragard IUD was inserted without difficulty. The string were cut to 3-4 cm. The tenaculum was removed. Slight oozing from the tenaculum site was stopped with pressure.   The patient tolerated the procedure well.    Chaperone was present for exam.  ASSESSMENT IUD insertion Stenotic cervix, dilated    PLAN Paragard placed F/U in one month Call with any concerns   An After Visit Summary was printed and given to the patient.

## 2018-06-03 NOTE — Telephone Encounter (Signed)
Patient has a IUD insertion appointment this afternoon. Patient states she has not finished her medication and is asking if she will need to reschedule?

## 2018-07-02 ENCOUNTER — Other Ambulatory Visit: Payer: Self-pay

## 2018-07-02 ENCOUNTER — Other Ambulatory Visit: Payer: Self-pay | Admitting: *Deleted

## 2018-07-02 DIAGNOSIS — E559 Vitamin D deficiency, unspecified: Secondary | ICD-10-CM

## 2018-07-09 ENCOUNTER — Other Ambulatory Visit: Payer: Self-pay

## 2018-07-15 ENCOUNTER — Telehealth: Payer: Self-pay | Admitting: Obstetrics and Gynecology

## 2018-07-15 ENCOUNTER — Other Ambulatory Visit: Payer: Self-pay

## 2018-07-15 NOTE — Telephone Encounter (Signed)
Call to patient regarding missed lab appointment. Patient works night shift and overslept. Will call back to reschedule.

## 2019-04-23 ENCOUNTER — Other Ambulatory Visit: Payer: Self-pay

## 2019-04-23 DIAGNOSIS — Z20822 Contact with and (suspected) exposure to covid-19: Secondary | ICD-10-CM

## 2019-04-24 LAB — NOVEL CORONAVIRUS, NAA: SARS-CoV-2, NAA: NOT DETECTED

## 2019-07-31 ENCOUNTER — Other Ambulatory Visit: Payer: Self-pay

## 2019-07-31 DIAGNOSIS — Z20822 Contact with and (suspected) exposure to covid-19: Secondary | ICD-10-CM

## 2019-08-01 LAB — NOVEL CORONAVIRUS, NAA: SARS-CoV-2, NAA: NOT DETECTED

## 2020-09-17 ENCOUNTER — Emergency Department (HOSPITAL_COMMUNITY)
Admission: EM | Admit: 2020-09-17 | Discharge: 2020-09-17 | Disposition: A | Discharge status: Home / Self Care | Payer: Self-pay | Attending: Emergency Medicine | Admitting: Emergency Medicine

## 2020-09-17 ENCOUNTER — Other Ambulatory Visit: Payer: Self-pay

## 2020-09-17 ENCOUNTER — Encounter (HOSPITAL_COMMUNITY): Payer: Self-pay | Admitting: *Deleted

## 2020-09-17 DIAGNOSIS — M545 Low back pain, unspecified: Secondary | ICD-10-CM | POA: Insufficient documentation

## 2020-09-17 DIAGNOSIS — F17210 Nicotine dependence, cigarettes, uncomplicated: Secondary | ICD-10-CM | POA: Insufficient documentation

## 2020-09-17 DIAGNOSIS — Z7982 Long term (current) use of aspirin: Secondary | ICD-10-CM | POA: Insufficient documentation

## 2020-09-17 MED ORDER — METHOCARBAMOL 500 MG PO TABS
500.0000 mg | ORAL_TABLET | Freq: Once | ORAL | Status: AC
  Administered 2020-09-17: 500 mg via ORAL
  Filled 2020-09-17: qty 1

## 2020-09-17 MED ORDER — PREDNISONE 50 MG PO TABS: 50.0000 mg | ORAL_TABLET | Freq: Every day | ORAL | 0 refills | Status: AC

## 2020-09-17 MED ORDER — HYDROCODONE-ACETAMINOPHEN 5-325 MG PO TABS
1.0000 | ORAL_TABLET | Freq: Once | ORAL | Status: AC
  Administered 2020-09-17: 1 via ORAL
  Filled 2020-09-17: qty 1

## 2020-09-17 MED ORDER — LIDOCAINE 5 % EX PTCH: 1.0000 | MEDICATED_PATCH | CUTANEOUS | 0 refills | Status: DC

## 2020-09-17 MED ORDER — METHOCARBAMOL 500 MG PO TABS: 500.0000 mg | ORAL_TABLET | Freq: Two times a day (BID) | ORAL | 0 refills | Status: DC

## 2020-09-17 MED ORDER — HYDROCODONE-ACETAMINOPHEN 5-325 MG PO TABS: 1.0000 | ORAL_TABLET | ORAL | 0 refills | Status: DC | PRN

## 2020-09-17 NOTE — Discharge Instructions (Signed)
Take the pain medication as prescribed.  DO not drive or operate heavy machinery while taking the pain medication (Norco)  Return for new or worsening symptoms:  Contact a health care provider if: You have pain that is not relieved with rest or medicine. You have increasing pain going down into your legs or buttocks. Your pain does not improve after 2 weeks. You have pain at night. You lose weight without trying. You have a fever or chills. Get help right away if: You develop new bowel or bladder control problems. You have unusual weakness or numbness in your arms or legs. You develop nausea or vomiting. You develop abdominal pain. You feel faint.

## 2020-09-17 NOTE — ED Triage Notes (Signed)
Pt with lower back pain x 2 days.  Pt denies any injuries or activity to cause pain.

## 2020-09-17 NOTE — ED Provider Notes (Signed)
[facility] Provider Note   CSN: [id number] Arrival date & time: 09/17/20  1058     History Chief Complaint  Patient presents with  . Back Pain    Marcia Wu is a 50 y.o. female with no significant past medical history who presents for evaluation of back pain.  Pain diffusely to her lower back.  Patient states symptoms are worse with movement.  No recent injury or trauma.  Has been taking ibuprofen with minimal relief difficult to rule, get out of bed secondary to pain.  Pain does not extend into the legs.  No history of bowel or bladder, [place].  She denies fever, chills, nausea, vomiting, chest pain, shortness of breath, abdominal pain, pelvic pain complaint dysuria hematuria, vaginal discharge, unilateral weakness redness, swelling, warmth to extremities.  Denies additional aggravating or alleviating factors.  Rates her pain a 9/10.  History obtained from patient and past medical records.  No interpreter used.  HPI     Past Medical History:  Diagnosis Date  . STD (sexually transmitted disease)     There are no problems to display for this patient.   History reviewed. No pertinent surgical history.   OB History    Gravida  4   Para  4   Term  3   Preterm  0   AB  0   Living  4     SAB  0   IAB  0   Ectopic  0   Multiple  0   Live Births  4           Family History  Problem Relation Age of Onset  . Asthma Mother   . Cancer Father     Social History   Tobacco Use  . Smoking status: Current Every Day Smoker    Packs/day: 1.00    Types: Cigarettes  . Smokeless tobacco: Never Used  Vaping Use  . Vaping Use: Former  Substance Use Topics  . Alcohol use: Yes  . Drug use: Not Currently    Home Medications Prior to Admission medications   Medication Sig Start Date End Date Taking? Authorizing Provider  HYDROcodone-acetaminophen (NORCO/VICODIN) 5-325 MG tablet Take 1 tablet by mouth every 4 (four) hours as needed.  09/17/20  Yes Bea Duren A, PA-C  lidocaine (LIDODERM) 5 % Place 1 patch onto the skin daily. Remove & Discard patch within 12 hours or as directed by MD 09/17/20  Yes Susanne Baumgarner A, PA-C  methocarbamol (ROBAXIN) 500 MG tablet Take 1 tablet (500 mg total) by mouth 2 (two) times daily. 09/17/20  Yes Timea Breed A, PA-C  predniSONE (DELTASONE) 50 MG tablet Take 1 tablet (50 mg total) by mouth daily for 5 days. 09/17/20 09/22/20 Yes Fatumata Kashani A, PA-C  ASPIRIN PO Take 1 tablet by mouth once as needed (for pain).    [provider]  metroNIDAZOLE (FLAGYL) 500 MG tablet Take 1 tablet (500 mg total) by mouth 2 (two) times daily. 05/22/18   Salvadore Dom, MD  Vitamin D, Ergocalciferol, (DRISDOL) 50000 units CAPS capsule Take 1 capsule (50,000 Units total) by mouth every 7 (seven) days. 04/16/18   Salvadore Dom, MD    Allergies    Patient has no known allergies.  Review of Systems   Review of Systems  Constitutional: Negative.   HENT: Negative.   Respiratory: Negative.   Cardiovascular: Negative.   Gastrointestinal: Negative.   Genitourinary: Negative.   Musculoskeletal: Positive for back pain.  Negative for arthralgias, gait problem, joint swelling, myalgias, neck pain and neck stiffness.  Skin: Negative.   Neurological: Negative.   All other systems reviewed and are negative.   Physical Exam Updated Vital Signs BP (!) 127/113 (BP Location: Right Arm)   Pulse 67   Temp 98.1 F (36.7 C) (Oral)   Resp 16   Ht 5\' 3"  (1.6 m)   Wt 70.8 kg   LMP  (LMP Unknown)   SpO2 100%   BMI 27.63 kg/m   Physical Exam  Physical Exam  Constitutional: Pt appears well-developed and well-nourished. No distress.  HENT:  Head: Normocephalic and atraumatic.  Mouth/Throat: Oropharynx is clear and moist. No oropharyngeal exudate.  Eyes: Conjunctivae are normal.  Neck: Normal range of motion. Neck supple.  Full ROM without pain  Cardiovascular: Normal rate, regular  rhythm and intact distal pulses.   Pulmonary/Chest: Effort normal and breath sounds normal. No respiratory distress. Pt has no wheezes.  Abdominal: Soft. Pt exhibits no distension. There is no tenderness, rebound or guarding. No abd bruit or pulsatile mass Musculoskeletal:  Full range of motion of the T-spine and L-spine with flexion, hyperextension, and lateral flexion. No midline tenderness or stepoffs. No tenderness to palpation of the spinous processes of the T-spine or L-spine. Mild tenderness to palpation of the paraspinous muscles of the L-spine. Negative straight leg raise. Lymphadenopathy:    Pt has no cervical adenopathy.  Neurological: Pt is alert. Pt has normal reflexes.  Reflex Scores:      Bicep reflexes are 2+ on the right side and 2+ on the left side.      Brachioradialis reflexes are 2+ on the right side and 2+ on the left side.      Patellar reflexes are 2+ on the right side and 2+ on the left side.      Achilles reflexes are 2+ on the right side and 2+ on the left side. Speech is clear and goal oriented, follows commands Normal 5/5 strength in upper and lower extremities bilaterally including dorsiflexion and plantar flexion, strong and equal grip strength Sensation normal to light and sharp touch Moves extremities without ataxia, coordination intact Normal gait Normal balance No Clonus Skin: Skin is warm and dry. No rash noted or lesions noted. Pt is not diaphoretic. No erythema, ecchymosis,edema or warmth.  Psychiatric: Pt has a normal mood and affect. Behavior is normal.  Nursing note and vitals reviewed. ED Results / Procedures / Treatments   Labs (all labs ordered are listed, but only abnormal results are displayed) Labs Reviewed - No data to display  EKG None  Radiology No results found.  Procedures Procedures   Medications Ordered in ED Medications  HYDROcodone-acetaminophen (NORCO/VICODIN) 5-325 MG per tablet 1 tablet (1 tablet Oral Given 09/17/20  1249)  methocarbamol (ROBAXIN) tablet 500 mg (500 mg Oral Given 09/17/20 1249)    ED Course  I have reviewed the triage vital signs and the nursing notes.  Pertinent labs & imaging results that were available during my care of the patient were reviewed by me and considered in my medical decision making (see chart for details).  50 year old presents for evaluation of nontraumatic back pain.  Located diffusely across her lumbar spine.  I am able to reproduce her pain.  Pain does not extend into her legs.  No history IV drug use, bowel or bladder incontinence, saddle paresthesia or malignancy.  No recent trauma or injury.  Her heart and lungs are clear.  She has a nonfocal  neuro exam without deficits.  She is ambulatory without difficulty.  Low suspicion for acute neurosurgical emergency such as cauda equina, discitis, abscess.  Abdomen soft, nontender.  She was no midline pulsatile abdominal suspicion for AAA, dissection, kidney stone, pyelonephritis or acute intra-abdominal process as cause of her back pain.  Will DC home with symptomatic treatment.  I do not feel she needs imaging at this time.  We will have her follow-up outpatient with PCP or return to the ED for any worsening symptoms.  Initial BP 123/113 repeat BP 115/60, feel prior BP was error   The patient has been appropriately medically screened and/or stabilized in the ED. I have low suspicion for any other emergent medical condition which would require further screening, evaluation or treatment in the ED or require inpatient management.  Patient is hemodynamically stable and in no acute distress.  Patient able to ambulate in department prior to ED.  Evaluation does not show acute pathology that would require ongoing or additional emergent interventions while in the emergency department or further inpatient treatment.  I have discussed the diagnosis with the patient and answered all questions.  Pain is been managed while in the emergency  department and patient has no further complaints prior to discharge.  Patient is comfortable with plan discussed in room and is stable for discharge at this time.  I have discussed strict return precautions for returning to the emergency department.  Patient was encouraged to follow-up with PCP/specialist refer to at discharge.    MDM Rules/Calculators/A&P                           Final Clinical Impression(s) / ED Diagnoses Final diagnoses:  Acute bilateral low back pain without sciatica    Rx / DC Orders ED Discharge Orders         Ordered    HYDROcodone-acetaminophen (NORCO/VICODIN) 5-325 MG tablet  Every 4 hours PRN        09/17/20 1315    lidocaine (LIDODERM) 5 %  Every 24 hours        09/17/20 1315    methocarbamol (ROBAXIN) 500 MG tablet  2 times daily        09/17/20 1315    predniSONE (DELTASONE) 50 MG tablet  Daily        09/17/20 1315           Kara Mierzejewski A, PA-C 09/17/20 1325    Sherwood Gambler, MD 09/20/20 [phone]

## 2020-12-06 ENCOUNTER — Other Ambulatory Visit: Payer: Self-pay

## 2020-12-06 ENCOUNTER — Emergency Department
Admission: EM | Admit: 2020-12-06 | Discharge: 2020-12-06 | Disposition: A | Discharge status: Home / Self Care | Payer: Self-pay | Attending: Emergency Medicine | Admitting: Emergency Medicine

## 2020-12-06 ENCOUNTER — Encounter: Payer: Self-pay | Admitting: Intensive Care

## 2020-12-06 DIAGNOSIS — R112 Nausea with vomiting, unspecified: Secondary | ICD-10-CM | POA: Insufficient documentation

## 2020-12-06 DIAGNOSIS — E860 Dehydration: Secondary | ICD-10-CM | POA: Insufficient documentation

## 2020-12-06 DIAGNOSIS — F17210 Nicotine dependence, cigarettes, uncomplicated: Secondary | ICD-10-CM | POA: Insufficient documentation

## 2020-12-06 DIAGNOSIS — R197 Diarrhea, unspecified: Secondary | ICD-10-CM | POA: Insufficient documentation

## 2020-12-06 LAB — URINALYSIS, COMPLETE (UACMP) WITH MICROSCOPIC
Bacteria, UA: NONE SEEN
Bilirubin Urine: NEGATIVE
Glucose, UA: NEGATIVE mg/dL
Ketones, ur: 5 mg/dL — AB
Leukocytes,Ua: NEGATIVE
Nitrite: NEGATIVE
Protein, ur: NEGATIVE mg/dL
Specific Gravity, Urine: 1.005 (ref 1.005–1.030)
pH: 7 (ref 5.0–8.0)

## 2020-12-06 LAB — COMPREHENSIVE METABOLIC PANEL
ALT: 14 U/L (ref 0–44)
AST: 17 U/L (ref 15–41)
Albumin: 3.7 g/dL (ref 3.5–5.0)
Alkaline Phosphatase: 70 U/L (ref 38–126)
Anion gap: 8 (ref 5–15)
BUN: 11 mg/dL (ref 6–20)
CO2: 22 mmol/L (ref 22–32)
Calcium: 9.1 mg/dL (ref 8.9–10.3)
Chloride: 108 mmol/L (ref 98–111)
Creatinine, Ser: 0.71 mg/dL (ref 0.44–1.00)
GFR, Estimated: >60 mL/min (ref >60)
Glucose, Bld: 96 mg/dL (ref 70–99)
Potassium: 3.5 mmol/L (ref 3.5–5.1)
Sodium: 138 mmol/L (ref 135–145)
Total Bilirubin: 0.5 mg/dL (ref 0.3–1.2)
Total Protein: 7 g/dL (ref 6.5–8.1)

## 2020-12-06 LAB — LIPASE, BLOOD: Lipase: 29 U/L (ref 11–51)

## 2020-12-06 LAB — CBC
HCT: 47 % — ABNORMAL HIGH (ref 36.0–46.0)
Hemoglobin: 16.1 g/dL — ABNORMAL HIGH (ref 12.0–15.0)
MCH: 28.8 pg (ref 26.0–34.0)
MCHC: 34.3 g/dL (ref 30.0–36.0)
MCV: 83.9 fL (ref 80.0–100.0)
Platelets: 336 10*3/uL (ref 150–400)
RBC: 5.6 MIL/uL — ABNORMAL HIGH (ref 3.87–5.11)
RDW: 14.4 % (ref 11.5–15.5)
WBC: 8 10*3/uL (ref 4.0–10.5)
nRBC: 0 % (ref 0.0–0.2)

## 2020-12-06 LAB — PREGNANCY, URINE: Preg Test, Ur: NEGATIVE

## 2020-12-06 MED ORDER — LACTATED RINGERS IV BOLUS
2000.0000 mL | Freq: Once | INTRAVENOUS | Status: AC
  Administered 2020-12-06: 2000 mL via INTRAVENOUS

## 2020-12-06 MED ORDER — ONDANSETRON 4 MG PO TBDP: 4.0000 mg | ORAL_TABLET | Freq: Three times a day (TID) | ORAL | 0 refills | Status: DC | PRN

## 2020-12-06 MED ORDER — KETOROLAC TROMETHAMINE 30 MG/ML IJ SOLN
15.0000 mg | Freq: Once | INTRAMUSCULAR | Status: AC
  Administered 2020-12-06: 15 mg via INTRAVENOUS
  Filled 2020-12-06: qty 1

## 2020-12-06 MED ORDER — ONDANSETRON HCL 4 MG/2ML IJ SOLN
4.0000 mg | Freq: Once | INTRAMUSCULAR | Status: AC
  Administered 2020-12-06: 4 mg via INTRAVENOUS
  Filled 2020-12-06: qty 2

## 2020-12-06 NOTE — ED Notes (Signed)
Pt states that symptoms have improved significantly since arriving. Pt provided with juice and crackers for PO challenge.

## 2020-12-06 NOTE — Discharge Instructions (Addendum)
You are being discharged with a prescription for Zofran to use as needed for any further nausea or vomiting.  Please take Tylenol and ibuprofen/Advil for your pain.  It is safe to take them together, or to alternate them every few hours.  Take up to 1000mg  of Tylenol at a time, up to 4 times per day.  Do not take more than 4000 mg of Tylenol in 24 hours.  For ibuprofen, take 400-600 mg, 4-5 times per day.  If you develop any worsening symptoms despite these medications, please return to the ED.

## 2020-12-06 NOTE — ED Triage Notes (Signed)
Patient c/o abdominal pain with N/V/D. Also reports headache. Symptoms started around 1pm. Blood sugar per EMS 125

## 2020-12-06 NOTE — ED Provider Notes (Signed)
[facility] Emergency Department Provider Note ____________________________________________   Event Date/Time   First MD Initiated Contact with Patient 12/06/20 1409     (approximate)  I have reviewed the triage vital signs and the nursing notes.  HISTORY  Chief Complaint Abdominal Pain   HPI Marcia Wu is a 50 y.o. femalewho presents to the ED for evaluation of nausea, vomiting and diarrhea.  Chart review indicates no relevant medical history.  Patient presents to the ED for evaluation of abdominal cramping, nausea, vomiting and diarrhea for the past 2-3 hours.  She reports eating a biscuit this morning and developing symptoms 1-2 hours after this.  She reports feeling normal yesterday.  She reports 5-10 episodes of watery diarrhea, and countless episodes of nonbloody nonbilious emesis that is since progressed to just dry heaving.  Reports some lightheaded dizziness and presyncope without syncope.  Denies discrete abdominal pain, but reports diffuse and mild cramping sensation and nausea that persists. No vaginal discharge or bleeding.   Past Medical History:  Diagnosis Date  . STD (sexually transmitted disease)     There are no problems to display for this patient.   History reviewed. No pertinent surgical history.  Prior to Admission medications   Medication Sig Start Date End Date Taking? Authorizing Provider  ondansetron (ZOFRAN ODT) 4 MG disintegrating tablet Take 1 tablet (4 mg total) by mouth every 8 (eight) hours as needed for nausea or vomiting. 12/06/20  Yes Vladimir Crofts, MD  ASPIRIN PO Take 1 tablet by mouth once as needed (for pain).    [provider]  HYDROcodone-acetaminophen (NORCO/VICODIN) 5-325 MG tablet Take 1 tablet by mouth every 4 (four) hours as needed. 09/17/20   Henderly, Britni A, PA-C  lidocaine (LIDODERM) 5 % Place 1 patch onto the skin daily. Remove & Discard patch within 12 hours or as directed by MD  09/17/20   Henderly, Britni A, PA-C  methocarbamol (ROBAXIN) 500 MG tablet Take 1 tablet (500 mg total) by mouth 2 (two) times daily. 09/17/20   Henderly, Britni A, PA-C  metroNIDAZOLE (FLAGYL) 500 MG tablet Take 1 tablet (500 mg total) by mouth 2 (two) times daily. 05/22/18   Salvadore Dom, MD  Vitamin D, Ergocalciferol, (DRISDOL) 50000 units CAPS capsule Take 1 capsule (50,000 Units total) by mouth every 7 (seven) days. 04/16/18   Salvadore Dom, MD    Allergies Patient has no known allergies.  Family History  Problem Relation Age of Onset  . Asthma Mother   . Cancer Father     Social History Social History   Tobacco Use  . Smoking status: Current Every Day Smoker    Packs/day: 1.00    Types: Cigarettes  . Smokeless tobacco: Never Used  Vaping Use  . Vaping Use: Former  Substance Use Topics  . Alcohol use: Yes    Comment: occ  . Drug use: Not Currently    Review of Systems  Constitutional: No fever/chills Eyes: No visual changes. ENT: No sore throat. Cardiovascular: Denies chest pain. Respiratory: Denies shortness of breath. Gastrointestinal: Positive for nausea, vomiting and diarrhea.   No constipation. Genitourinary: Negative for dysuria. Musculoskeletal: Negative for back pain. Skin: Negative for rash. Neurological: Negative for headaches, focal weakness or numbness.  ____________________________________________   PHYSICAL EXAM:  VITAL SIGNS: Vitals:   12/06/20 1500 12/06/20 1530  BP: 113/72 122/66  Pulse: (!) 53 (!) 50  Resp: 15 17  Temp:    SpO2: 100% 100%  Constitutional: Alert and oriented. Well appearing and in no acute distress. Eyes: Conjunctivae are normal. PERRL. EOMI. Head: Atraumatic. Nose: No congestion/rhinnorhea. Mouth/Throat: Mucous membranes are dry.  Oropharynx non-erythematous. Neck: No stridor. No cervical spine tenderness to palpation. Cardiovascular: Normal rate, regular rhythm. Grossly normal heart sounds.  Good  peripheral circulation. Respiratory: Normal respiratory effort.  No retractions. Lungs CTAB. Gastrointestinal: Soft , nondistended, nontender to palpation. No CVA tenderness. Musculoskeletal: No lower extremity tenderness nor edema.  No joint effusions. No signs of acute trauma. Neurologic:  Normal speech and language. No gross focal neurologic deficits are appreciated. No gait instability noted. Skin:  Skin is warm, dry and intact. No rash noted. Psychiatric: Mood and affect are normal. Speech and behavior are normal.  ____________________________________________   LABS (all labs ordered are listed, but only abnormal results are displayed)  Labs Reviewed  CBC - Abnormal; Notable for the following components:      Result Value   RBC 5.60 (*)    Hemoglobin 16.1 (*)    HCT 47.0 (*)    All other components within normal limits  URINALYSIS, COMPLETE (UACMP) WITH MICROSCOPIC - Abnormal; Notable for the following components:   Color, Urine STRAW (*)    APPearance CLEAR (*)    Hgb urine dipstick MODERATE (*)    Ketones, ur 5 (*)    All other components within normal limits  LIPASE, BLOOD  COMPREHENSIVE METABOLIC PANEL  PREGNANCY, URINE   ____________________________________________   PROCEDURES and INTERVENTIONS  Procedure(s) performed (including Critical Care):  .1-3 Lead EKG Interpretation Performed by: Vladimir Crofts, MD Authorized by: Vladimir Crofts, MD     Interpretation: normal     ECG rate:  64   ECG rate assessment: normal     Rhythm: sinus rhythm     Ectopy: none     Conduction: normal      Medications  lactated ringers bolus 2,000 mL (0 mLs Intravenous Stopped 12/06/20 1542)  ondansetron (ZOFRAN) injection 4 mg (4 mg Intravenous Given 12/06/20 1445)  ketorolac (TORADOL) 30 MG/ML injection 15 mg (15 mg Intravenous Given 12/06/20 1505)    ____________________________________________   MDM / ED COURSE   Otherwise healthy 50 year old woman presents to the ED with  acute nausea, vomiting and diarrhea, likely viral gastroenteritis versus foodborne illness, and ultimately amenable to outpatient management.  Normal vitals on room air.  Exam reassuring without evidence of abdominal tenderness or pain.  Dry mucous membranes are noted, along with her ketonuria, suggestive of dehydration.  Blood work is benign and patient is not pregnant.  No evidence of UTI.  No indications for abdominal imaging at this time.  After rehydration, antiemetics and anti-inflammatories, patient reports resolving symptoms and tolerates p.o. intake.  We discussed return precautions for the ED prior to discharge.  Provided prescription for Zofran as well.   Clinical Course as of 12/06/20 1624  Mon Dec 06, 2020  1623 Reassessed.  Patient reports feeling much better and is thankful for care.  We discussed p.o. challenge and outpatient management.  She is agreeable. [DS]    Clinical Course User Index [DS] Vladimir Crofts, MD    ____________________________________________   FINAL CLINICAL IMPRESSION(S) / ED DIAGNOSES  Final diagnoses:  Nausea vomiting and diarrhea  Dehydration     ED Discharge Orders         Ordered    ondansetron (ZOFRAN ODT) 4 MG disintegrating tablet  Every 8 hours PRN        12/06/20 1623  Vladimir Crofts   Note:  This document was prepared using Systems analyst and may include unintentional dictation errors.   Vladimir Crofts, MD 12/06/20 [phone]

## 2020-12-13 ENCOUNTER — Other Ambulatory Visit: Payer: Self-pay

## 2020-12-13 ENCOUNTER — Emergency Department
Admission: EM | Admit: 2020-12-13 | Discharge: 2020-12-13 | Disposition: A | Discharge status: Home / Self Care | Payer: Self-pay | Attending: Emergency Medicine | Admitting: Emergency Medicine

## 2020-12-13 ENCOUNTER — Encounter: Payer: Self-pay | Admitting: Emergency Medicine

## 2020-12-13 DIAGNOSIS — F17210 Nicotine dependence, cigarettes, uncomplicated: Secondary | ICD-10-CM | POA: Insufficient documentation

## 2020-12-13 DIAGNOSIS — Z7982 Long term (current) use of aspirin: Secondary | ICD-10-CM | POA: Insufficient documentation

## 2020-12-13 DIAGNOSIS — R109 Unspecified abdominal pain: Secondary | ICD-10-CM | POA: Insufficient documentation

## 2020-12-13 NOTE — ED Triage Notes (Signed)
Patient was seen here on 4/18 for vomiting, was given note to return to work. Called EMS the next day, but decided not to be transported. Patient's employer is now requiring note to return to work since EMS visit to home. Patient has no complaints.

## 2020-12-13 NOTE — ED Provider Notes (Signed)
[facility] Emergency Department Provider Note  ____________________________________________   Event Date/Time   First MD Initiated Contact with Patient 12/13/20 [phone]     (approximate)  I have reviewed the triage vital signs and the nursing notes.   HISTORY  Chief Complaint Note for work   HPI Marcia Wu is a 50 y.o. female who presents requesting a note stating she is able to return back to work.  Patient states she came in on 4/18 for some nausea vomiting and crampy abdominal pain which has since improved.  She notes her friend called EMS the following day because she still was not quite better but she declined to be evaluated in ED at that time.  She states that she has not had any vomiting diarrhea nausea chest pain cough shortness of breath back pain or any other acute symptoms last couple days.  She states she had some abdominal crampiness earlier in the week but this is almost now completely resolved.  He denies any other acute complaints and states she is eating well drinking well peeing and moving her bowels without any difficulty.  She states she feels she is ready go back to work.         Past Medical History:  Diagnosis Date  . STD (sexually transmitted disease)     There are no problems to display for this patient.   History reviewed. No pertinent surgical history.  Prior to Admission medications   Medication Sig Start Date End Date Taking? Authorizing Provider  ASPIRIN PO Take 1 tablet by mouth once as needed (for pain).    [provider]  HYDROcodone-acetaminophen (NORCO/VICODIN) 5-325 MG tablet Take 1 tablet by mouth every 4 (four) hours as needed. 09/17/20   Henderly, Britni A, PA-C  lidocaine (LIDODERM) 5 % Place 1 patch onto the skin daily. Remove & Discard patch within 12 hours or as directed by MD 09/17/20   Henderly, Britni A, PA-C  methocarbamol (ROBAXIN) 500 MG tablet Take 1 tablet (500 mg total) by mouth 2 (two)  times daily. 09/17/20   Henderly, Britni A, PA-C  metroNIDAZOLE (FLAGYL) 500 MG tablet Take 1 tablet (500 mg total) by mouth 2 (two) times daily. 05/22/18   Salvadore Dom, MD  ondansetron (ZOFRAN ODT) 4 MG disintegrating tablet Take 1 tablet (4 mg total) by mouth every 8 (eight) hours as needed for nausea or vomiting. 12/06/20   Vladimir Crofts, MD  Vitamin D, Ergocalciferol, (DRISDOL) 50000 units CAPS capsule Take 1 capsule (50,000 Units total) by mouth every 7 (seven) days. 04/16/18   Salvadore Dom, MD    Allergies Patient has no known allergies.  Family History  Problem Relation Age of Onset  . Asthma Mother   . Cancer Father     Social History Social History   Tobacco Use  . Smoking status: Current Every Day Smoker    Packs/day: 1.00    Types: Cigarettes  . Smokeless tobacco: Never Used  Vaping Use  . Vaping Use: Former  Substance Use Topics  . Alcohol use: Yes    Comment: occ  . Drug use: Not Currently    Review of Systems  Review of Systems  Constitutional: Negative for chills and fever.  HENT: Negative for sore throat.   Eyes: Negative for pain.  Respiratory: Negative for cough and stridor.   Cardiovascular: Negative for chest pain.  Gastrointestinal: Positive for abdominal pain ( very mild residual cramps, almost compeletely resolved). Negative for vomiting.  Genitourinary: Negative for dysuria.  Musculoskeletal: Negative for myalgias.  Skin: Negative for rash.  Neurological: Negative for seizures, loss of consciousness and headaches.  Psychiatric/Behavioral: Negative for suicidal ideas.  All other systems reviewed and are negative.     ____________________________________________   PHYSICAL EXAM:  VITAL SIGNS: ED Triage Vitals  Enc Vitals Group     BP 12/13/20 0952 131/86     Pulse Rate 12/13/20 0952 65     Resp 12/13/20 0952 16     Temp 12/13/20 0952 98.9 F (37.2 C)     Temp Source 12/13/20 0952 Oral     SpO2 12/13/20 0952 100 %      Weight 12/13/20 0943 149 lb 14.6 oz (68 kg)     Height 12/13/20 0943 5\' 5"  (1.651 m)     Head Circumference --      Peak Flow --      Pain Score 12/13/20 0943 0     Pain Loc --      Pain Edu? --      Excl. in [place]? --    Vitals:   12/13/20 0952  BP: 131/86  Pulse: 65  Resp: 16  Temp: 98.9 F (37.2 C)  SpO2: 100%   Physical Exam Vitals and nursing note reviewed.  Constitutional:      General: She is not in acute distress.    Appearance: She is well-developed.  HENT:     Head: Normocephalic and atraumatic.     Right Ear: External ear normal.     Left Ear: External ear normal.     Nose: Nose normal.  Eyes:     Conjunctiva/sclera: Conjunctivae normal.  Cardiovascular:     Rate and Rhythm: Normal rate and regular rhythm.     Heart sounds: No murmur heard.   Pulmonary:     Effort: Pulmonary effort is normal. No respiratory distress.     Breath sounds: Normal breath sounds.  Abdominal:     Palpations: Abdomen is soft.     Tenderness: There is no abdominal tenderness. There is no right CVA tenderness or left CVA tenderness.  Musculoskeletal:     Cervical back: Neck supple.  Skin:    General: Skin is warm and dry.     Capillary Refill: Capillary refill takes less than 2 seconds.  Neurological:     Mental Status: She is alert and oriented to person, place, and time.  Psychiatric:        Mood and Affect: Mood normal.      ____________________________________________   LABS (all labs ordered are listed, but only abnormal results are displayed)  Labs Reviewed - No data to display ____________________________________________  EKG  ____________________________________________  RADIOLOGY  ED MD interpretation:   Official radiology report(s): No results found.  ____________________________________________   PROCEDURES  Procedure(s) performed (including Critical Care):  Procedures   ____________________________________________   INITIAL IMPRESSION /  ASSESSMENT AND PLAN / ED COURSE        Patient presents with above stationary exam requesting a note saying she is able to return back to work today.  She notes she was seen on 18 for some nausea vomiting diarrhea and crampy abdominal pain and that her abdominal pain is now almost completely gone and she no longer has any nausea or vomiting or diarrhea in last couple days.  She denies any other acute complaints.  She is afebrile hemodynamically stable arrival.  Her abdomen is soft nontender and she has no CVA tenderness.  She does  not appear dehydrated overall very low suspicion for significant metabolic derangement, surgical emergency i.e. appendicitis SBO PID or ovarian torsion or other significant persistent infectious process or other immediate life-threatening pathology at this time.  Suspect she is likely still recovering from the effects of acute infectious gastroenteritis last week.  I do believe she is safe for discharge with plan for returning to work and close outpatient follow-up.  Discharged stable condition.  Strict return precautions advised and discussed.        ____________________________________________   FINAL CLINICAL IMPRESSION(S) / ED DIAGNOSES  Final diagnoses:  Abdominal pain, unspecified abdominal location    Medications - No data to display   ED Discharge Orders    None       Note:  This document was prepared using Dragon voice recognition software and may include unintentional dictation errors.   Lucrezia Starch, MD 12/13/20 1038

## 2021-01-27 ENCOUNTER — Emergency Department
Admission: EM | Admit: 2021-01-27 | Discharge: 2021-01-27 | Disposition: A | Discharge status: Home / Self Care | Payer: Self-pay | Attending: Emergency Medicine | Admitting: Emergency Medicine

## 2021-01-27 ENCOUNTER — Encounter: Payer: Self-pay | Admitting: Intensive Care

## 2021-01-27 ENCOUNTER — Other Ambulatory Visit: Payer: Self-pay

## 2021-01-27 ENCOUNTER — Emergency Department: Payer: Self-pay

## 2021-01-27 DIAGNOSIS — R202 Paresthesia of skin: Secondary | ICD-10-CM | POA: Insufficient documentation

## 2021-01-27 DIAGNOSIS — M25511 Pain in right shoulder: Secondary | ICD-10-CM | POA: Insufficient documentation

## 2021-01-27 DIAGNOSIS — M5413 Radiculopathy, cervicothoracic region: Secondary | ICD-10-CM | POA: Insufficient documentation

## 2021-01-27 DIAGNOSIS — F17210 Nicotine dependence, cigarettes, uncomplicated: Secondary | ICD-10-CM | POA: Insufficient documentation

## 2021-01-27 MED ORDER — NAPROXEN 500 MG PO TABS: 500.0000 mg | ORAL_TABLET | Freq: Two times a day (BID) | ORAL | 0 refills | Status: DC

## 2021-01-27 MED ORDER — METHOCARBAMOL 500 MG PO TABS: 500.0000 mg | ORAL_TABLET | Freq: Every evening | ORAL | 0 refills | Status: DC | PRN

## 2021-01-27 NOTE — Discharge Instructions (Addendum)
Make an appoint with Dr. Sharlet Salina who is the orthopedist on-call.  Begin taking medication only as directed.  Do not take the muscle relaxant while you are driving or operating machinery as it could cause drowsiness and increase your risk for injury.  Wear the sling only for the next 3 to 4 days as needed for discomfort.  After that do not wear as she could end up with a frozen shoulder.

## 2021-01-27 NOTE — ED Provider Notes (Signed)
[facility] Emergency Department Provider Note  ____________________________________________   Event Date/Time   First MD Initiated Contact with Patient 01/27/21 1145     (approximate)  I have reviewed the triage vital signs and the nursing notes.   HISTORY  Chief Complaint Arm Pain    HPI Marcia Wu is a 50 y.o. female presents to the ED with complaint of right arm pain for 1 week.  Patient denies any known injury.  She states that the pain radiates down into her hand.  She has not taken any over-the-counter medications for this and has continued to go to work until today.  She states that there is a "tingling" in her hand became somewhat worse and concerning.         Past Medical History:  Diagnosis Date   STD (sexually transmitted disease)     There are no problems to display for this patient.   History reviewed. No pertinent surgical history.  Prior to Admission medications   Medication Sig Start Date End Date Taking? Authorizing Provider  methocarbamol (ROBAXIN) 500 MG tablet Take 1 tablet (500 mg total) by mouth at bedtime as needed for muscle spasms. 01/27/21  Yes Letitia Neri L, PA-C  naproxen (NAPROSYN) 500 MG tablet Take 1 tablet (500 mg total) by mouth 2 (two) times daily with a meal. 01/27/21  Yes Letitia Neri L, PA-C  ASPIRIN PO Take 1 tablet by mouth once as needed (for pain).    [provider]  HYDROcodone-acetaminophen (NORCO/VICODIN) 5-325 MG tablet Take 1 tablet by mouth every 4 (four) hours as needed. 09/17/20   Henderly, Britni A, PA-C  lidocaine (LIDODERM) 5 % Place 1 patch onto the skin daily. Remove & Discard patch within 12 hours or as directed by MD 09/17/20   Henderly, Britni A, PA-C  metroNIDAZOLE (FLAGYL) 500 MG tablet Take 1 tablet (500 mg total) by mouth 2 (two) times daily. 05/22/18   Salvadore Dom, MD  ondansetron (ZOFRAN ODT) 4 MG disintegrating tablet Take 1 tablet (4 mg total) by mouth  every 8 (eight) hours as needed for nausea or vomiting. 12/06/20   Vladimir Crofts, MD  Vitamin D, Ergocalciferol, (DRISDOL) 50000 units CAPS capsule Take 1 capsule (50,000 Units total) by mouth every 7 (seven) days. 04/16/18   Salvadore Dom, MD    Allergies Patient has no known allergies.  Family History  Problem Relation Age of Onset   Asthma Mother    Cancer Father     Social History Social History   Tobacco Use   Smoking status: Every Day    Packs/day: 1.00    Pack years: 0.00    Types: Cigarettes   Smokeless tobacco: Never  Vaping Use   Vaping Use: Former  Substance Use Topics   Alcohol use: Yes    Alcohol/week: 4.0 standard drinks    Types: 4 Cans of beer per week   Drug use: Yes    Types: Marijuana    Review of Systems Constitutional: No fever/chills Eyes: No visual changes. ENT: No sore throat. Cardiovascular: Denies chest pain. Respiratory: Denies shortness of breath. Gastrointestinal: No abdominal pain.  No nausea, no vomiting.  Musculoskeletal: Positive for right shoulder pain. Skin: Negative for rash. Neurological: Negative for headaches, focal weakness or numbness. ____________________________________________   PHYSICAL EXAM:  VITAL SIGNS: ED Triage Vitals  Enc Vitals Group     BP 01/27/21 1138 120/64     Pulse Rate 01/27/21 1138 82  Resp 01/27/21 1138 16     Temp 01/27/21 1138 98.1 F (36.7 C)     Temp Source 01/27/21 1138 Oral     SpO2 01/27/21 1138 98 %     Weight 01/27/21 1139 150 lb (68 kg)     Height 01/27/21 1139 5\' 3"  (1.6 m)     Head Circumference --      Peak Flow --      Pain Score 01/27/21 1138 4     Pain Loc --      Pain Edu? --      Excl. in [place]? --     Constitutional: Alert and oriented. Well appearing and in no acute distress. Eyes: Conjunctivae are normal.  Head: Atraumatic. Neck: No stridor.  No cervical tenderness on palpation posteriorly. Cardiovascular: Normal rate, regular rhythm. Grossly normal heart  sounds.  Good peripheral circulation. Respiratory: Normal respiratory effort.  No retractions. Lungs CTAB. Gastrointestinal: Soft and nontender. No distention.  Musculoskeletal: On examination of the right shoulder there is no gross deformity and no soft tissue edema or discoloration noted.  There is no crepitus with range of motion patient is able to go in all 4 planes slowly.  Radial pulse is present.  Good muscle strength and grip is noted bilaterally.  No tenderness on palpation of cervical spine posteriorly.  Range of motion without restriction. Neurologic:  Normal speech and language. No gross focal neurologic deficits are appreciated.  Skin:  Skin is warm, dry and intact.  Psychiatric: Mood and affect are normal. Speech and behavior are normal.  ____________________________________________   LABS (all labs ordered are listed, but only abnormal results are displayed)  Labs Reviewed - No data to display ____________________________________________  EKG Reviewed by physician. Normal sinus rhythm with sinus arrhythmia.  Ventricular rate 75, PR interval 126, QRS duration 74.  ____________________________________________  RADIOLOGY I, Johnn Hai, personally viewed and evaluated these images (plain radiographs) as part of my medical decision making, as well as reviewing the written report by the radiologist.   Official radiology report(s): DG Shoulder Right  Result Date: 01/27/2021 CLINICAL DATA:  Pain for 1 week EXAM: RIGHT SHOULDER - 2+ VIEW COMPARISON:  None. FINDINGS: Oblique, Y scapular, and axillary images were obtained. No acute fracture or dislocation. Suggestion of Hill-Sachs type defect lateral humeral head region. No appreciable joint space narrowing. There is calcification inferior to the acromioclavicular joint. No erosive change. Visualized right lung clear. IMPRESSION: No acute fracture or dislocation. Suggestion of Hill-Sachs defect on the right laterally. Question  previous anterior dislocation. No appreciable joint space narrowing. Note that there is calcification inferior to the acromioclavicular joint which may have arthropathic etiology or represent a degree of calcific tendinosis. This focus of calcification potentially places patient at increased risk for impingement syndrome. Electronically Signed   By: Lowella Grip III M.D.   On: 01/27/2021 14:39    ____________________________________________   PROCEDURES  Procedure(s) performed (including Critical Care):  Procedures   ____________________________________________   INITIAL IMPRESSION / ASSESSMENT AND PLAN / ED COURSE  As part of my medical decision making, I reviewed the following data within the electronic MEDICAL RECORD NUMBER   51 year old female presents to the ED with complaint of right shoulder pain that is occurred without history of injury for the last week.  Patient has not taken any of the counter medication for her pain.  X-rays are suggestive of Hill-Sachs defect with  a nerve impingement possibility with tendinosis in that area.  Patient was made  aware.  She is agreeable to take an anti-inflammatory and a muscle relaxant just at bedtime.  She was placed in a sling for support for the next 3 to 4 days.  She is to follow-up with Dr. Sharlet Salina who is on-call for orthopedics.  Note was written for work today. ____________________________________________   FINAL CLINICAL IMPRESSION(S) / ED DIAGNOSES  Final diagnoses:  Acute pain of right shoulder  Radiculopathy of cervicothoracic region     ED Discharge Orders          Ordered    naproxen (NAPROSYN) 500 MG tablet  2 times daily with meals        01/27/21 1503    methocarbamol (ROBAXIN) 500 MG tablet  At bedtime PRN        01/27/21 1503             Note:  This document was prepared using Dragon voice recognition software and may include unintentional dictation errors.    Johnn Hai, PA-C 01/27/21 1517     Vladimir Crofts, MD 01/27/21 [phone]

## 2021-01-27 NOTE — ED Triage Notes (Signed)
Patient c/o right arm/shoulder pain X1 week. Denies injury. Reports waking up one day with pain. Also c/o numbness in hand

## 2021-07-18 ENCOUNTER — Emergency Department (HOSPITAL_COMMUNITY)
Admission: EM | Admit: 2021-07-18 | Discharge: 2021-07-18 | Disposition: A | Discharge status: Home / Self Care | Payer: Self-pay | Attending: Emergency Medicine | Admitting: Emergency Medicine

## 2021-07-18 ENCOUNTER — Emergency Department (HOSPITAL_COMMUNITY): Payer: Self-pay

## 2021-07-18 ENCOUNTER — Encounter (HOSPITAL_COMMUNITY): Payer: Self-pay | Admitting: Emergency Medicine

## 2021-07-18 DIAGNOSIS — M25562 Pain in left knee: Secondary | ICD-10-CM | POA: Insufficient documentation

## 2021-07-18 DIAGNOSIS — F17210 Nicotine dependence, cigarettes, uncomplicated: Secondary | ICD-10-CM | POA: Insufficient documentation

## 2021-07-18 MED ORDER — NAPROXEN 250 MG PO TABS
500.0000 mg | ORAL_TABLET | Freq: Once | ORAL | Status: AC
  Administered 2021-07-18: 03:00:00 500 mg via ORAL
  Filled 2021-07-18: qty 2

## 2021-07-18 NOTE — Discharge Instructions (Signed)
You were evaluated in the Emergency Department and after careful evaluation, we did not find any emergent condition requiring admission or further testing in the hospital.  Your exam/testing today was overall reassuring.  X-ray was normal.  Recommend rest, Tylenol or Motrin for discomfort.  Please return to the Emergency Department if you experience any worsening of your condition.  Thank you for allowing [place] to be a part of your care.

## 2021-07-18 NOTE — ED Notes (Signed)
Pt came here from work with cc of knee pain for a couple days. Says its swollen. Able to place some weight on it.  Getting dressed into gown

## 2021-07-18 NOTE — ED Triage Notes (Signed)
Pt c/o left knee pain and swelling for the past 3 days. Pt denies injury.

## 2021-07-18 NOTE — ED Provider Notes (Signed)
[facility] Emergency Department Provider Note MRN:  [id number]  Arrival date & time: 07/18/21     Chief Complaint   Knee Pain   History of Present Illness   Marcia Wu is a 50 y.o. year-old female with no pertinent past medical history presenting to the ED with chief complaint of knee pain.  Location: Left knee Duration: 3 days Onset: Gradual Timing: Constant Description: Ache Severity: Moderate Exacerbating/Alleviating Factors: Worse with ambulating Associated Symptoms: Swelling to the knee Pertinent Negatives: No fever, no rash, no trauma  Additional History: Works 12-hour shifts and is walking and on her feet nearly the entire time  Review of Systems  A problem-focused ROS was performed. Positive for knee pain.  Patient denies fever.  Patient's Health History    Past Medical History:  Diagnosis Date   STD (sexually transmitted disease)     History reviewed. No pertinent surgical history.  Family History  Problem Relation Age of Onset   Asthma Mother    Cancer Father     Social History   Socioeconomic History   Marital status: Single    Spouse name: Not on file   Number of children: Not on file   Years of education: Not on file   Highest education level: Not on file  Occupational History   Not on file  Tobacco Use   Smoking status: Every Day    Packs/day: 1.00    Types: Cigarettes   Smokeless tobacco: Never  Vaping Use   Vaping Use: Former  Substance and Sexual Activity   Alcohol use: Yes    Alcohol/week: 4.0 standard drinks    Types: 4 Cans of beer per week   Drug use: Yes    Types: Marijuana   Sexual activity: Yes    Birth control/protection: None  Other Topics Concern   Not on file  Social History Narrative   Not on file   Social Determinants of Health   Financial Resource Strain: Not on file  Food Insecurity: Not on file  Transportation Needs: Not on file  Physical Activity: Not on file  Stress: Not  on file  Social Connections: Not on file  Intimate Partner Violence: Not on file     Physical Exam   Vitals:   07/18/21 0211  BP: 127/78  Pulse: 87  Resp: 16  Temp: 98.4 F (36.9 C)  SpO2: 97%    CONSTITUTIONAL: Well-appearing, NAD NEURO:  Alert and oriented x 3, no focal deficits EYES:  eyes equal and reactive ENT/NECK:  no LAD, no JVD CARDIO: Regular rate, well-perfused, normal S1 and S2 PULM:  CTAB no wheezing or rhonchi GI/GU:  normal bowel sounds, non-distended, non-tender MSK/SPINE:  No gross deformities SKIN:  no rash, atraumatic PSYCH:  Appropriate speech and behavior  *Additional and/or pertinent findings included in MDM below  Diagnostic and Interventional Summary    EKG Interpretation  Date/Time:    Ventricular Rate:    PR Interval:    QRS Duration:   QT Interval:    QTC Calculation:   R Axis:     Text Interpretation:         Labs Reviewed - No data to display  DG Knee Complete 4 Views Left    (Results Pending)    Medications  naproxen (NAPROSYN) tablet 500 mg (500 mg Oral Given 07/18/21 0239)     Procedures  /  Critical Care Procedures  ED Course and Medical Decision Making  I have reviewed the triage vital  signs, the nursing notes, and pertinent available records from the EMR.  Listed above are laboratory and imaging tests that I personally ordered, reviewed, and interpreted and then considered in my medical decision making (see below for details).  The knee on exam is without increased warmth, no erythema, the limb is neurovascularly intact distally.  There is a palpable small effusion to the left knee clinically.  Range of motion is largely preserved.  Overall favoring and inflammatory effusion due to osteoarthritis, brought on by overuse given patient's work.  Will obtain screening x-ray to exclude any bony abnormalities.  Nothing to suggest septic joint or other emergency at this time, appropriate for rest and NSAIDs at home.        Barth Kirks. Sedonia Small, MD [place] [email or web address]  Final Clinical Impressions(s) / ED Diagnoses     ICD-10-CM   1. Acute pain of left knee  M25.562       ED Discharge Orders     None        Discharge Instructions Discussed with and Provided to Patient:   Discharge Instructions   None       Maudie Flakes, MD 07/18/21 [phone]

## 2022-02-13 ENCOUNTER — Emergency Department (HOSPITAL_COMMUNITY): Payer: Self-pay

## 2022-02-13 ENCOUNTER — Other Ambulatory Visit: Payer: Self-pay

## 2022-02-13 ENCOUNTER — Emergency Department (HOSPITAL_COMMUNITY)
Admission: EM | Admit: 2022-02-13 | Discharge: 2022-02-14 | Disposition: A | Discharge status: Home / Self Care | Payer: Self-pay | Attending: Emergency Medicine | Admitting: Emergency Medicine

## 2022-02-13 DIAGNOSIS — D259 Leiomyoma of uterus, unspecified: Secondary | ICD-10-CM

## 2022-02-13 DIAGNOSIS — R112 Nausea with vomiting, unspecified: Secondary | ICD-10-CM | POA: Insufficient documentation

## 2022-02-13 DIAGNOSIS — E860 Dehydration: Secondary | ICD-10-CM | POA: Insufficient documentation

## 2022-02-13 DIAGNOSIS — R1032 Left lower quadrant pain: Secondary | ICD-10-CM | POA: Insufficient documentation

## 2022-02-13 DIAGNOSIS — R1031 Right lower quadrant pain: Secondary | ICD-10-CM | POA: Insufficient documentation

## 2022-02-13 DIAGNOSIS — E876 Hypokalemia: Secondary | ICD-10-CM | POA: Insufficient documentation

## 2022-02-13 LAB — URINALYSIS, ROUTINE W REFLEX MICROSCOPIC
Bilirubin Urine: NEGATIVE
Glucose, UA: NEGATIVE mg/dL
Ketones, ur: 80 mg/dL — AB
Nitrite: NEGATIVE
Protein, ur: 100 mg/dL — AB
Specific Gravity, Urine: 1.031 — ABNORMAL HIGH (ref 1.005–1.030)
pH: 6 (ref 5.0–8.0)

## 2022-02-13 LAB — COMPREHENSIVE METABOLIC PANEL
ALT: 13 U/L (ref 0–44)
AST: 15 U/L (ref 15–41)
Albumin: 3.2 g/dL — ABNORMAL LOW (ref 3.5–5.0)
Alkaline Phosphatase: 85 U/L (ref 38–126)
Anion gap: 10 (ref 5–15)
BUN: 10 mg/dL (ref 6–20)
CO2: 22 mmol/L (ref 22–32)
Calcium: 8.9 mg/dL (ref 8.9–10.3)
Chloride: 104 mmol/L (ref 98–111)
Creatinine, Ser: 0.69 mg/dL (ref 0.44–1.00)
GFR, Estimated: >60 mL/min (ref >60)
Glucose, Bld: 127 mg/dL — ABNORMAL HIGH (ref 70–99)
Potassium: 3.2 mmol/L — ABNORMAL LOW (ref 3.5–5.1)
Sodium: 136 mmol/L (ref 135–145)
Total Bilirubin: 1 mg/dL (ref 0.3–1.2)
Total Protein: 7.5 g/dL (ref 6.5–8.1)

## 2022-02-13 LAB — LACTIC ACID, PLASMA: Lactic Acid, Venous: 1.3 mmol/L (ref 0.5–1.9)

## 2022-02-13 LAB — CBC
HCT: 39.8 % (ref 36.0–46.0)
Hemoglobin: 13.4 g/dL (ref 12.0–15.0)
MCH: 28.6 pg (ref 26.0–34.0)
MCHC: 33.7 g/dL (ref 30.0–36.0)
MCV: 85 fL (ref 80.0–100.0)
Platelets: 346 10*3/uL (ref 150–400)
RBC: 4.68 MIL/uL (ref 3.87–5.11)
RDW: 14.1 % (ref 11.5–15.5)
WBC: 21 10*3/uL — ABNORMAL HIGH (ref 4.0–10.5)
nRBC: 0 % (ref 0.0–0.2)

## 2022-02-13 LAB — POC URINE PREG, ED: Preg Test, Ur: NEGATIVE

## 2022-02-13 LAB — LIPASE, BLOOD: Lipase: 38 U/L (ref 11–51)

## 2022-02-13 MED ORDER — IOHEXOL 300 MG/ML  SOLN
100.0000 mL | Freq: Once | INTRAMUSCULAR | Status: AC | PRN
  Administered 2022-02-13: 100 mL via INTRAVENOUS

## 2022-02-13 MED ORDER — SODIUM CHLORIDE 0.9 % IV BOLUS
1000.0000 mL | Freq: Once | INTRAVENOUS | Status: AC
  Administered 2022-02-13: 1000 mL via INTRAVENOUS

## 2022-02-13 MED ORDER — HYDROMORPHONE HCL 1 MG/ML IJ SOLN
1.0000 mg | Freq: Once | INTRAMUSCULAR | Status: AC
  Administered 2022-02-13: 1 mg via INTRAVENOUS
  Filled 2022-02-13: qty 1

## 2022-02-13 MED ORDER — ONDANSETRON HCL 4 MG/2ML IJ SOLN
4.0000 mg | Freq: Once | INTRAMUSCULAR | Status: AC
  Administered 2022-02-13: 4 mg via INTRAVENOUS
  Filled 2022-02-13: qty 2

## 2022-02-13 NOTE — ED Triage Notes (Signed)
Patient coming to ED for evaluation of abdominal pain.  Reports pain started 2 weeks ago.  Became more severe yesterday.  Has had nausea and vomiting x 3 days.  Given Zofran 4 mg IV by EMS.  C/o constipation and pain to lower right abdomen. No vaginal bleeding

## 2022-02-13 NOTE — ED Notes (Signed)
Patient transported to CT 

## 2022-02-16 ENCOUNTER — Encounter (HOSPITAL_COMMUNITY): Payer: Self-pay

## 2022-02-16 ENCOUNTER — Emergency Department (HOSPITAL_COMMUNITY): Payer: Self-pay

## 2022-02-16 ENCOUNTER — Emergency Department (HOSPITAL_COMMUNITY)
Admission: EM | Admit: 2022-02-16 | Discharge: 2022-02-16 | Disposition: A | Discharge status: Home / Self Care | Payer: Self-pay | Attending: Emergency Medicine | Admitting: Emergency Medicine

## 2022-02-16 ENCOUNTER — Other Ambulatory Visit: Payer: Self-pay

## 2022-02-16 DIAGNOSIS — R531 Weakness: Secondary | ICD-10-CM | POA: Insufficient documentation

## 2022-02-16 DIAGNOSIS — R112 Nausea with vomiting, unspecified: Secondary | ICD-10-CM | POA: Insufficient documentation

## 2022-02-16 DIAGNOSIS — R001 Bradycardia, unspecified: Secondary | ICD-10-CM | POA: Insufficient documentation

## 2022-02-16 DIAGNOSIS — E876 Hypokalemia: Secondary | ICD-10-CM | POA: Insufficient documentation

## 2022-02-16 DIAGNOSIS — R1030 Lower abdominal pain, unspecified: Secondary | ICD-10-CM | POA: Insufficient documentation

## 2022-02-16 LAB — CBC WITH DIFFERENTIAL/PLATELET
Abs Immature Granulocytes: 0.19 10*3/uL — ABNORMAL HIGH (ref 0.00–0.07)
Basophils Absolute: 0.1 10*3/uL (ref 0.0–0.1)
Basophils Relative: 0 %
Eosinophils Absolute: 0 10*3/uL (ref 0.0–0.5)
Eosinophils Relative: 0 %
HCT: 43.5 % (ref 36.0–46.0)
Hemoglobin: 14.3 g/dL (ref 12.0–15.0)
Immature Granulocytes: 1 %
Lymphocytes Relative: 5 %
Lymphs Abs: 1.3 10*3/uL (ref 0.7–4.0)
MCH: 27.9 pg (ref 26.0–34.0)
MCHC: 32.9 g/dL (ref 30.0–36.0)
MCV: 85 fL (ref 80.0–100.0)
Monocytes Absolute: 1.1 10*3/uL — ABNORMAL HIGH (ref 0.1–1.0)
Monocytes Relative: 5 %
Neutro Abs: 21.8 10*3/uL — ABNORMAL HIGH (ref 1.7–7.7)
Neutrophils Relative %: 89 %
Platelets: 429 10*3/uL — ABNORMAL HIGH (ref 150–400)
RBC: 5.12 MIL/uL — ABNORMAL HIGH (ref 3.87–5.11)
RDW: 13.8 % (ref 11.5–15.5)
WBC: 24.4 10*3/uL — ABNORMAL HIGH (ref 4.0–10.5)
nRBC: 0 % (ref 0.0–0.2)

## 2022-02-16 LAB — COMPREHENSIVE METABOLIC PANEL
ALT: 17 U/L (ref 0–44)
AST: 15 U/L (ref 15–41)
Albumin: 3.4 g/dL — ABNORMAL LOW (ref 3.5–5.0)
Alkaline Phosphatase: 112 U/L (ref 38–126)
Anion gap: 12 (ref 5–15)
BUN: 5 mg/dL — ABNORMAL LOW (ref 6–20)
CO2: 24 mmol/L (ref 22–32)
Calcium: 9.1 mg/dL (ref 8.9–10.3)
Chloride: 106 mmol/L (ref 98–111)
Creatinine, Ser: 0.69 mg/dL (ref 0.44–1.00)
GFR, Estimated: >60 mL/min (ref >60)
Glucose, Bld: 134 mg/dL — ABNORMAL HIGH (ref 70–99)
Potassium: 2.9 mmol/L — ABNORMAL LOW (ref 3.5–5.1)
Sodium: 142 mmol/L (ref 135–145)
Total Bilirubin: 0.8 mg/dL (ref 0.3–1.2)
Total Protein: 7.8 g/dL (ref 6.5–8.1)

## 2022-02-16 LAB — URINALYSIS, ROUTINE W REFLEX MICROSCOPIC
Bilirubin Urine: NEGATIVE
Glucose, UA: NEGATIVE mg/dL
Ketones, ur: 5 mg/dL — AB
Nitrite: NEGATIVE
Protein, ur: NEGATIVE mg/dL
Specific Gravity, Urine: 1.005 (ref 1.005–1.030)
pH: 8 (ref 5.0–8.0)

## 2022-02-16 LAB — LIPASE, BLOOD: Lipase: 26 U/L (ref 11–51)

## 2022-02-16 MED ORDER — DICYCLOMINE HCL 20 MG PO TABS
20.0000 mg | ORAL_TABLET | Freq: Two times a day (BID) | ORAL | 0 refills | Status: DC
Start: 2022-02-16 — End: 2023-12-13

## 2022-02-16 MED ORDER — POTASSIUM CHLORIDE 10 MEQ/100ML IV SOLN
10.0000 meq | INTRAVENOUS | Status: AC
  Administered 2022-02-16 (×2): 10 meq via INTRAVENOUS
  Filled 2022-02-16 (×2): qty 100

## 2022-02-16 MED ORDER — POTASSIUM CHLORIDE 20 MEQ PO PACK
40.0000 meq | PACK | Freq: Once | ORAL | Status: AC
Start: 2022-02-16 — End: 2022-02-16
  Administered 2022-02-16: 40 meq via ORAL
  Filled 2022-02-16: qty 2

## 2022-02-16 MED ORDER — HALOPERIDOL LACTATE 5 MG/ML IJ SOLN
5.0000 mg | Freq: Once | INTRAMUSCULAR | Status: AC
  Administered 2022-02-16: 5 mg via INTRAVENOUS
  Filled 2022-02-16: qty 1

## 2022-02-16 MED ORDER — DEXTROSE 50 % IV SOLN
INTRAVENOUS | Status: AC
  Filled 2022-02-16: qty 50

## 2022-02-16 MED ORDER — ONDANSETRON 4 MG PO TBDP: 4.0000 mg | ORAL_TABLET | Freq: Three times a day (TID) | ORAL | 0 refills | Status: DC | PRN

## 2022-02-16 MED ORDER — SODIUM CHLORIDE 0.9 % IV BOLUS
1000.0000 mL | Freq: Once | INTRAVENOUS | Status: AC
  Administered 2022-02-16: 1000 mL via INTRAVENOUS

## 2022-02-16 NOTE — ED Provider Notes (Signed)
[facility] EMERGENCY DEPARTMENT Provider Note   CSN: [id number] Arrival date & time: 02/16/22  1126     History Chief Complaint  Patient presents with   Abdominal Pain   Nausea    FAE BLOSSOM is a 51 y.o. female patient who presents to the emergency department for further evaluation of lower abdominal pain has been ongoing for 2 weeks but worsening over the last 4 to 5 days.  Patient was seen here 3 days ago for similar symptoms.  She ultimately had a negative work-up apart from evidence of leukocytosis and uterine fibroids.  Patient has been at home for last 3 days still having intractable vomiting with any p.o. intake.  She feels generally weak and having lower abdominal pain.  She denies fever or chills.  No urinary complaints.   Abdominal Pain      Home Medications Prior to Admission medications   Medication Sig Start Date End Date Taking? Authorizing Provider  dicyclomine (BENTYL) 20 MG tablet Take 1 tablet (20 mg total) by mouth 2 (two) times daily. 02/16/22  Yes Raul Del, Kingson Lohmeyer M, PA-C  ibuprofen (ADVIL) 200 MG tablet Take 600 mg by mouth every 6 (six) hours as needed.   Yes [provider]  ondansetron (ZOFRAN-ODT) 4 MG disintegrating tablet Take 1 tablet (4 mg total) by mouth every 8 (eight) hours as needed for nausea or vomiting. 02/16/22  Yes Hendricks Limes, PA-C      Allergies    Patient has no known allergies.    Review of Systems   Review of Systems  Gastrointestinal:  Positive for abdominal pain.  All other systems reviewed and are negative.   Physical Exam Updated Vital Signs BP 111/65   Pulse (!) 55   Temp 97.6 F (36.4 C) (Axillary)   Resp (!) 22   Ht '5\' 3"'$  (1.6 m)   Wt 68 kg   SpO2 99%   BMI 26.57 kg/m  Physical Exam Vitals and nursing note reviewed.  Constitutional:      General: She is not in acute distress.    Appearance: Normal appearance. She is ill-appearing.  HENT:     Head: Normocephalic and atraumatic.      Mouth/Throat:     Mouth: Mucous membranes are dry.  Eyes:     General:        Right eye: No discharge.        Left eye: No discharge.  Cardiovascular:     Rate and Rhythm: Bradycardia present.     Comments: S1/S2 are distinct without any evidence of murmur, rubs, or gallops.  Radial pulses are 2+ bilaterally.  Dorsalis pedis pulses are 2+ bilaterally.  No evidence of pedal edema. Pulmonary:     Comments: Clear to auscultation bilaterally.  Normal effort.  No respiratory distress.  No evidence of wheezes, rales, or rhonchi heard throughout. Abdominal:     General: Abdomen is flat. Bowel sounds are normal. There is no distension.     Tenderness: There is no abdominal tenderness. There is no guarding or rebound.  Musculoskeletal:        General: Normal range of motion.     Cervical back: Neck supple.  Skin:    General: Skin is warm and dry.     Findings: No rash.  Neurological:     General: No focal deficit present.     Mental Status: She is alert.  Psychiatric:        Mood and Affect: Mood normal.  Behavior: Behavior normal.     ED Results / Procedures / Treatments   Labs (all labs ordered are listed, but only abnormal results are displayed) Labs Reviewed  CBC WITH DIFFERENTIAL/PLATELET - Abnormal; Notable for the following components:      Result Value   WBC 24.4 (*)    RBC 5.12 (*)    Platelets 429 (*)    Neutro Abs 21.8 (*)    Monocytes Absolute 1.1 (*)    Abs Immature Granulocytes 0.19 (*)    All other components within normal limits  COMPREHENSIVE METABOLIC PANEL - Abnormal; Notable for the following components:   Potassium 2.9 (*)    Glucose, Bld 134 (*)    BUN 5 (*)    Albumin 3.4 (*)    All other components within normal limits  URINALYSIS, ROUTINE W REFLEX MICROSCOPIC - Abnormal; Notable for the following components:   Color, Urine STRAW (*)    Hgb urine dipstick MODERATE (*)    Ketones, ur 5 (*)    Leukocytes,Ua MODERATE (*)    Bacteria, UA RARE (*)     All other components within normal limits  LIPASE, BLOOD    EKG None  Radiology US PELVIS (TRANSABDOMINAL ONLY)  Result Date: 02/16/2022 CLINICAL DATA:  Right lower quadrant pain for 2 weeks.  IUD. EXAM: TRANSABDOMINAL ULTRASOUND OF PELVIS DOPPLER ULTRASOUND OF OVARIES TECHNIQUE: Transabdominal ultrasound examination of the pelvis was performed including evaluation of the uterus, ovaries, adnexal regions, and pelvic cul-de-sac. Color and duplex Doppler ultrasound was utilized to evaluate blood flow to the ovaries. COMPARISON:  CT abdomen and pelvis 02/13/2022 FINDINGS: Uterus Measurements: 14.1 x 8.7 x 8.4 cm = volume: 540 mL. There is a central heterogeneous mass within the uterus measuring 6.5 x 7.8 x 5.9 cm. There is no significant vascularity in this region. Endometrium Small amount of endometrium is seen in the lower uterine segment measuring 4 mm. IUD is present in this region. Right ovary Measurements: 2.4 x 2.1 x 2.9 cm = volume: 8 mL. Normal appearance/no adnexal mass. Left ovary Measurements: 2.0 x 2.8 x 2.2 cm = volume: 6 mL. Normal appearance/no adnexal mass. Free fluid None. IMPRESSION: 1. Heterogeneous mass in the central uterus measuring 7.8 cm. This is favored as an intramural/submucosal fibroid; however, given location, an endometrial lesion can not be entirely excluded. Normal portion of endometrium in the lower uterine segment is identified with IUD. Consider further evaluation with MRI. Gynecology consultation recommended. 2. Normal appearance of ovaries. Electronically Signed   By: Ronney Asters M.D.   On: 02/16/2022 18:04    Procedures Procedures    Medications Ordered in ED Medications  sodium chloride 0.9 % bolus 1,000 mL (0 mLs Intravenous Stopped 02/16/22 1729)  haloperidol lactate (HALDOL) injection 5 mg (5 mg Intravenous Given 02/16/22 1358)  potassium chloride 10 mEq in 100 mL IVPB (0 mEq Intravenous Stopped 02/16/22 1729)  potassium chloride (KLOR-CON) packet 40 mEq  (40 mEq Oral Given 02/16/22 1509)    ED Course/ Medical Decision Making/ A&P Clinical Course as of 02/16/22 1830  Thu Feb 16, 2022  1816 CBC with Differential(!) There is evidence of significant leukocytosis. [CF]  1818 Urinalysis, Routine w reflex microscopic Urine, Clean Catch(!) There is moderate amounts of leukocytes, pyuria without any evidence of epithelial contamination.  Considering the patient has a large leukocytosis I will likely put the patient on antibiotics for UTI. [CF]  1820 I spoke with Dr. Elly Modena with gynecology who recommends [facility]. [CF]  1822 Comprehensive metabolic panel(!) There is evidence of hypokalemia.  [CF]  1828 Lipase, blood Lipase is normal.  [CF]    Clinical Course User Index [CF] Hendricks Limes, PA-C                           Medical Decision Making AANIKA DEFOOR is a 51 y.o. female patient who presents to the emergency permit for further evaluation of lower abdominal pain, nausea, and vomiting.  Patient was seen here 3 days ago and had essentially a normal work-up apart from significant leukocytosis.  CT did show evidence of uterine fibroids.  Patient still having similar symptoms.  Patient does appear clinically dry.  We will give her some fluids in addition to repeat labs.  I do not feel that repeating a CT would be of benefit today.  We will add on a pelvic ultrasound to rule out torsion and further evaluate for the uterine fibroid that was seen on CT.   Amount and/or Complexity of Data Reviewed Labs: ordered. Decision-making details documented in ED Course. Radiology: ordered and independent interpretation performed. Decision-making details documented in ED Course.  Risk Prescription drug management. Parenteral controlled substances. Risk Details: Patient feeling better after Haldol, fluids, and potassium for her hypokalemia.  I do feel the patient would likely benefit from further evaluation outside the  hospital.  I spoke with gynecology who will evaluate her in the office and talk about the large uterine fibroid.  I suspect this might be causing some of her pain.  Going to prescribe her Bentyl for abdominal cramping in addition to Zofran for antiemetics.  I instructed her to stick with a clear liquid diet for the next few days and slowly incorporate her diet back in.  All questions or concerns addressed.  She is here for discharge.    Final Clinical Impression(s) / ED Diagnoses Final diagnoses:  Lower abdominal pain  Hypokalemia    Rx / DC Orders ED Discharge Orders          Ordered    dicyclomine (BENTYL) 20 MG tablet  2 times daily        02/16/22 1828    ondansetron (ZOFRAN-ODT) 4 MG disintegrating tablet  Every 8 hours PRN        02/16/22 1828              Hendricks Limes, PA-C 02/16/22 1830    Milton Ferguson, MD 02/17/22 1727

## 2022-02-16 NOTE — ED Triage Notes (Signed)
Pt arrives via ems from home. Pt c/o abdominal pain, nausea, and vomiting x 2 weeks. Reports it has become worse. Pt was seen here 3 days ago.

## 2022-02-16 NOTE — ED Notes (Signed)
US tech at bedside

## 2022-02-16 NOTE — Discharge Instructions (Signed)
Please follow-up with [facility] for further evaluation of this possible uterine fibroid.  I would like for you to take Bentyl for abdominal cramping and Bentyl write you a prescription for Zofran for nausea.  Please drink plenty of fluids and get plenty of rest.  I would stick to a clear liquid diet for the next few days and slowly incorporate your normal diet back.  Please return to the emergency department for any worsening symptoms you might have.

## 2022-03-15 ENCOUNTER — Other Ambulatory Visit (HOSPITAL_COMMUNITY)
Admission: RE | Admit: 2022-03-15 | Discharge: 2022-03-15 | Disposition: A | Discharge status: Home / Self Care | Payer: 59 | Source: Ambulatory Visit | Attending: Obstetrics & Gynecology | Admitting: Obstetrics & Gynecology

## 2022-03-15 ENCOUNTER — Encounter: Payer: Self-pay | Admitting: Obstetrics & Gynecology

## 2022-03-15 ENCOUNTER — Ambulatory Visit (INDEPENDENT_AMBULATORY_CARE_PROVIDER_SITE_OTHER): Payer: 59 | Admitting: Obstetrics & Gynecology

## 2022-03-15 VITALS — BP 128/86 | HR 76 | Ht 63.0 in | Wt 149.6 lb

## 2022-03-15 DIAGNOSIS — R69 Illness, unspecified: Secondary | ICD-10-CM | POA: Diagnosis not present

## 2022-03-15 DIAGNOSIS — Z124 Encounter for screening for malignant neoplasm of cervix: Secondary | ICD-10-CM

## 2022-03-15 DIAGNOSIS — D251 Intramural leiomyoma of uterus: Secondary | ICD-10-CM | POA: Diagnosis not present

## 2022-03-15 DIAGNOSIS — Z975 Presence of (intrauterine) contraceptive device: Secondary | ICD-10-CM | POA: Diagnosis not present

## 2022-03-15 DIAGNOSIS — Z113 Encounter for screening for infections with a predominantly sexual mode of transmission: Secondary | ICD-10-CM | POA: Insufficient documentation

## 2022-03-15 DIAGNOSIS — Z1231 Encounter for screening mammogram for malignant neoplasm of breast: Secondary | ICD-10-CM

## 2022-03-15 DIAGNOSIS — N760 Acute vaginitis: Secondary | ICD-10-CM | POA: Diagnosis not present

## 2022-03-15 MED ORDER — METRONIDAZOLE 500 MG PO TABS: 500.0000 mg | ORAL_TABLET | Freq: Two times a day (BID) | ORAL | 0 refills | Status: AC

## 2022-03-15 NOTE — Patient Instructions (Signed)
Please schedule a mammogram at one of the following locations:  [place]: [phone]  [facility] [place]:[phone] [address]  

## 2022-03-15 NOTE — Progress Notes (Addendum)
GYN VISIT Patient name: Marcia Wu MRN [id number]  Date of birth: 02/28/1971 Chief Complaint:   Follow-up (Abdominal pain-better)  History of Present Illness:   Marcia Wu is a 51 y.o. [phone] female being seen today for ER follow up:.     Pt seen in ED due to abdominal pain. Imaging completed which revealed large uterus with fibroids and was advised to follow up here.  Pt states she is no longer having pelvic pain, her biggest concern is an odor milky white discharge.  This has been ongoing for quite some time- seems to come and go.  Discharge seems to be worse recently.   Menses are irregular- not every month.  Menses last for a few days, denies HMB.  IUD placed a few years ago for contraception.   Pelvic US reviewed: I 1. Heterogeneous mass in the central uterus measuring 7.8 cm. This is favored as an intramural/submucosal fibroid; however, given location, an endometrial lesion can not be entirely excluded. Normal portion of endometrium in the lower uterine segment is identified with IUD. Consider further evaluation with MRI. Gynecology consultation recommended. 2. Normal appearance of ovaries.  No LMP recorded. (Menstrual status: IUD).     03/15/2022    3:25 PM  Depression screen PHQ 2/9  Decreased Interest 0  Down, Depressed, Hopeless 0  PHQ - 2 Score 0  Altered sleeping 0  Tired, decreased energy 0  Change in appetite 1  Feeling bad or failure about yourself  0  Trouble concentrating 0  Moving slowly or fidgety/restless 0  Suicidal thoughts 0  PHQ-9 Score 1     Review of Systems:   Pertinent items are noted in HPI Denies fever/chills, dizziness, headaches, visual disturbances, fatigue, shortness of breath, chest pain, abdominal pain, vomiting, no problems with periods,urination, or intercourse unless otherwise stated above.  Pertinent History Reviewed:  Reviewed past medical,surgical, social, obstetrical and family history.  Reviewed problem  list, medications and allergies. Physical Assessment:   Vitals:   03/15/22 1519  BP: 128/86  Pulse: 76  Weight: 149 lb 9.6 oz (67.9 kg)  Height: '5\' 3"'$  (1.6 m)  Body mass index is 26.5 kg/m.       Physical Examination:   General appearance: alert, well appearing, and in no distress  Psych: mood appropriate, normal affect  Skin: warm & dry   Cardiovascular: normal heart rate noted  Respiratory: normal respiratory effort, no distress  Abdomen: soft, non-tender   Pelvic: VULVA: normal appearing vulva with no masses, tenderness or lesions, VAGINA: normal appearing vagina, frothy white to yellow odorous dishcarge noted CERVIX: normal appearing cervix without discharge or lesions, white strings visualized at os UTERUS: enlarged mobile uterus, ADNEXA: no tenderness appreciated  Extremities: no edema   Chaperone:  pt declined     Assessment & Plan:  1) Acute vaginitis -findings suggestive of BV, Rx sent in  2) Uterine mass/fibroid -[place] independently reviewed and exam completed- findings suggestive of uterine fibroid -pt currently asymptomatic -should pt note return of abdominal pain, change in bleeding or other acute change would consider further imaging at that time  3) Contraceptive- IUD in place -IUD in place, though device has shifted.  Base don perimenopausal status would hold off on removal at this time.  Should she note HMB, change in frequency would consider removal -encouraged pt to try to obtain prior records to review when IUD was placed.  Of note, white strings suggestive of Paragard  4) Preventive screening -pap collected -mammogram ordered -  encouraged pt to establish care with PCP   Orders Placed This Encounter  Procedures   MM 3D SCREEN BREAST BILATERAL   Meds ordered this encounter  Medications   metroNIDAZOLE (FLAGYL) 500 MG tablet    Sig: Take 1 tablet (500 mg total) by mouth 2 (two) times daily for 7 days.    Dispense:  14 tablet    Refill:  0     Return for please print AVS.   Janyth Pupa, DO Attending [place], [place] for [employer], [place]

## 2022-03-15 NOTE — Addendum Note (Signed)
Addended by: Octaviano Glow on: 03/15/2022 04:17 PM   Modules accepted: Orders

## 2022-03-20 LAB — CYTOLOGY - PAP
Adequacy: ABSENT
Chlamydia: NEGATIVE
Comment: NEGATIVE
Comment: NEGATIVE
Comment: NORMAL
Diagnosis: NEGATIVE
High risk HPV: NEGATIVE
Neisseria Gonorrhea: NEGATIVE

## 2022-03-22 ENCOUNTER — Encounter: Payer: Self-pay | Admitting: Adult Health

## 2022-06-05 ENCOUNTER — Encounter (HOSPITAL_COMMUNITY): Payer: Self-pay

## 2022-06-05 ENCOUNTER — Emergency Department (HOSPITAL_COMMUNITY)
Admission: EM | Admit: 2022-06-05 | Discharge: 2022-06-05 | Disposition: A | Discharge status: Home / Self Care | Payer: 59 | Attending: Emergency Medicine | Admitting: Emergency Medicine

## 2022-06-05 ENCOUNTER — Emergency Department (HOSPITAL_COMMUNITY): Payer: 59

## 2022-06-05 ENCOUNTER — Other Ambulatory Visit: Payer: Self-pay

## 2022-06-05 DIAGNOSIS — M79622 Pain in left upper arm: Secondary | ICD-10-CM | POA: Insufficient documentation

## 2022-06-05 DIAGNOSIS — M25512 Pain in left shoulder: Secondary | ICD-10-CM | POA: Diagnosis not present

## 2022-06-05 DIAGNOSIS — M19022 Primary osteoarthritis, left elbow: Secondary | ICD-10-CM | POA: Diagnosis not present

## 2022-06-05 DIAGNOSIS — R9431 Abnormal electrocardiogram [ECG] [EKG]: Secondary | ICD-10-CM | POA: Diagnosis not present

## 2022-06-05 MED ORDER — HYDROCODONE-ACETAMINOPHEN 5-325 MG PO TABS: ORAL_TABLET | ORAL | 0 refills | Status: DC

## 2022-06-05 MED ORDER — ACETAMINOPHEN 325 MG PO TABS
650.0000 mg | ORAL_TABLET | Freq: Once | ORAL | Status: AC
  Administered 2022-06-05: 650 mg via ORAL
  Filled 2022-06-05: qty 2

## 2022-06-05 MED ORDER — NAPROXEN 500 MG PO TABS: 500.0000 mg | ORAL_TABLET | Freq: Two times a day (BID) | ORAL | 0 refills | Status: DC

## 2022-06-05 NOTE — Discharge Instructions (Signed)
You may apply warm heat on and off to your shoulder.  Take the medication as directed.  Take the naproxen with food.  Follow-up with Dr. Ruthe Mannan office in 1 week if your symptoms or not improving.

## 2022-06-05 NOTE — ED Triage Notes (Signed)
Pt presents to ED with complaints of left arm pain from shoulder to elbow. Pt denies injury. Pt states hurts to lift arm.

## 2022-06-08 NOTE — ED Provider Notes (Signed)
[facility] EMERGENCY DEPARTMENT Provider Note   CSN: [id number] Arrival date & time: 06/05/22  1256     History  Chief Complaint  Patient presents with   Arm Pain    NERIDA BOIVIN is a 51 y.o. female.   Arm Pain Pertinent negatives include no chest pain, no abdominal pain, no headaches and no shortness of breath.       CAITLEN WORTH is a 51 y.o. female who presents to the Emergency Department complaining of left shoulder and left upper arm pain.  Symptoms have been present for several days.  Gradually worsening.  She describes significant pain when she attempts to raise her left arm.  Pain radiates from her shoulder to the level of her elbow.  Describes pain as aching.  She denies any swelling or numbness.  No known injury.  But does state that she does frequent repetitive movements with her arms.  She denies any neck pain, headaches, dizziness, numbness or tingling of her extremities.  No chest pain.  Home Medications Prior to Admission medications   Medication Sig Start Date End Date Taking? Authorizing Provider  HYDROcodone-acetaminophen (NORCO/VICODIN) 5-325 MG tablet Take one tab po q 4 hrs prn pain 06/05/22  Yes Akasha Melena, PA-C  naproxen (NAPROSYN) 500 MG tablet Take 1 tablet (500 mg total) by mouth 2 (two) times daily. Take with food 06/05/22  Yes Loney Domingo, PA-C  dicyclomine (BENTYL) 20 MG tablet Take 1 tablet (20 mg total) by mouth 2 (two) times daily. Patient not taking: Reported on 03/15/2022 02/16/22   Hendricks Limes, PA-C  ibuprofen (ADVIL) 200 MG tablet Take 600 mg by mouth every 6 (six) hours as needed.    [provider]  ondansetron (ZOFRAN-ODT) 4 MG disintegrating tablet Take 1 tablet (4 mg total) by mouth every 8 (eight) hours as needed for nausea or vomiting. Patient not taking: Reported on 03/15/2022 02/16/22   Hendricks Limes, PA-C      Allergies    Patient has no known allergies.    Review of Systems   Review of  Systems  Constitutional:  Negative for chills and fever.  Respiratory:  Negative for shortness of breath.   Cardiovascular:  Negative for chest pain.  Gastrointestinal:  Negative for abdominal pain, nausea and vomiting.  Musculoskeletal:  Positive for arthralgias (Left arm pain left shoulder pain). Negative for back pain and neck pain.  Skin:  Negative for rash.  Neurological:  Negative for dizziness, weakness, numbness and headaches.    Physical Exam Updated Vital Signs BP 129/72 (BP Location: Right Arm)   Pulse 77   Temp 98.2 F (36.8 C) (Oral)   Resp 16   Ht '5\' 3"'$  (1.6 m)   Wt 68 kg   SpO2 100%   BMI 26.57 kg/m  Physical Exam Vitals and nursing note reviewed.  Constitutional:      General: She is not in acute distress.    Appearance: Normal appearance. She is not toxic-appearing.  Cardiovascular:     Rate and Rhythm: Normal rate and regular rhythm.     Pulses: Normal pulses.  Pulmonary:     Effort: Pulmonary effort is normal.     Breath sounds: Normal breath sounds.  Chest:     Chest wall: No tenderness.  Musculoskeletal:        General: Tenderness present. No swelling or deformity.     Left shoulder: Tenderness and bony tenderness present. No swelling, deformity or crepitus. Normal range of motion. Normal  strength. Normal pulse.     Cervical back: Normal range of motion. No tenderness.     Comments: Tenderness palpation over the left AC joint.  No edema, erythema or excessive warmth.  Pain on range of motion, no bony step-offs or crepitus.  She has full range of motion of the left elbow.  Grip strengths are strong and symmetrical bilaterally.  Skin:    General: Skin is warm.     Capillary Refill: Capillary refill takes less than 2 seconds.     Findings: No erythema or rash.  Neurological:     General: No focal deficit present.     Mental Status: She is alert.     Sensory: No sensory deficit.     Motor: No weakness.     ED Results / Procedures / Treatments    Labs (all labs ordered are listed, but only abnormal results are displayed) Labs Reviewed - No data to display  EKG EKG Interpretation  Date/Time:  Monday June 05 2022 13:45:54 EDT Ventricular Rate:  72 PR Interval:  128 QRS Duration: 80 QT Interval:  396 QTC Calculation: 433 R Axis:   27 Text Interpretation: Normal sinus rhythm Low voltage QRS Borderline ECG When compared with ECG of 16-Feb-2022 11:51, PREVIOUS ECG IS PRESENT Confirmed by Wynona Dove ([id number]) on 06/06/2022 1:26:48 PM  Radiology No results found.  Procedures Procedures    Medications Ordered in ED Medications  acetaminophen (TYLENOL) tablet 650 mg (650 mg Oral Given 06/05/22 1639)    ED Course/ Medical Decision Making/ A&P                          Medical Decision Making Patient here for evaluation of left shoulder pain.  No known injury but endorses repetitive movements of her arms.  She is right-hand dominant.  No numbness or tingling of the extremities.   On exam, patient well-appearing nontoxic.  Vital signs are reassuring.  Neurovascularly intact.  She does have pain with range of motion of the left shoulder.  I do not appreciate any bony step-offs or symptoms to suggest septic joint.  I suspect musculoskeletal process.  Her differential would also include dislocation, fracture, septic joint, radiculopathy.  Low clinical suspicion for fracture or dislocation given lack of trauma, she does not have any cervical tenderness or pain with range of motion of the neck and no neurovascular deficits on her exam today.  Amount and/or Complexity of Data Reviewed Radiology: ordered.    Details: X-ray of the left shoulder shows subacromial spur, left elbow shows small effusion without acute bony finding. Discussion of management or test interpretation with external provider(s): Patient with degenerative changes of the elbow and shoulder.  There is a spur present which may be causing her symptoms.  Neurovascularly  intact.  She is agreeable to symptomatic treatment and close outpatient follow-up.  She appears appropriate for discharge home.  Risk OTC drugs. Prescription drug management.           Final Clinical Impression(s) / ED Diagnoses Final diagnoses:  Acute pain of left shoulder    Rx / DC Orders ED Discharge Orders          Ordered    HYDROcodone-acetaminophen (NORCO/VICODIN) 5-325 MG tablet        06/05/22 1622    naproxen (NAPROSYN) 500 MG tablet  2 times daily        06/05/22 1622  Kem Parkinson, PA-C 06/08/22 1522    Wyvonnia Dusky, MD 06/08/22 [phone]

## 2022-07-26 ENCOUNTER — Encounter (HOSPITAL_COMMUNITY): Payer: Self-pay | Admitting: *Deleted

## 2022-07-26 ENCOUNTER — Emergency Department (HOSPITAL_COMMUNITY): Payer: Self-pay

## 2022-07-26 ENCOUNTER — Emergency Department (HOSPITAL_COMMUNITY)
Admission: EM | Admit: 2022-07-26 | Discharge: 2022-07-26 | Disposition: A | Discharge status: Home / Self Care | Payer: Self-pay | Attending: Emergency Medicine | Admitting: Emergency Medicine

## 2022-07-26 ENCOUNTER — Other Ambulatory Visit: Payer: Self-pay

## 2022-07-26 DIAGNOSIS — Z1152 Encounter for screening for COVID-19: Secondary | ICD-10-CM | POA: Insufficient documentation

## 2022-07-26 DIAGNOSIS — J111 Influenza due to unidentified influenza virus with other respiratory manifestations: Secondary | ICD-10-CM | POA: Insufficient documentation

## 2022-07-26 LAB — RESP PANEL BY RT-PCR (FLU A&B, COVID) ARPGX2
Influenza A by PCR: NEGATIVE
Influenza B by PCR: NEGATIVE
SARS Coronavirus 2 by RT PCR: NEGATIVE

## 2022-07-26 MED ORDER — BENZONATATE 100 MG PO CAPS: 200.0000 mg | ORAL_CAPSULE | Freq: Three times a day (TID) | ORAL | 0 refills | Status: DC | PRN

## 2022-07-26 MED ORDER — IBUPROFEN 800 MG PO TABS
800.0000 mg | ORAL_TABLET | Freq: Once | ORAL | Status: AC
  Administered 2022-07-26: 800 mg via ORAL
  Filled 2022-07-26: qty 1

## 2022-07-26 MED ORDER — OSELTAMIVIR PHOSPHATE 75 MG PO CAPS: 75.0000 mg | ORAL_CAPSULE | Freq: Two times a day (BID) | ORAL | 0 refills | Status: DC

## 2022-07-26 NOTE — ED Triage Notes (Signed)
Pt c/o generalized body aches x 2 days

## 2022-07-26 NOTE — ED Provider Notes (Signed)
[facility] Provider Note   CSN: [id number] Arrival date & time: 07/26/22  1023     History  Chief Complaint  Patient presents with   Generalized Body Aches    Marcia Wu is a 51 y.o. female with no significant past medical history presenting with a 3-day history of flulike symptoms.  She describes generalized body aches, headache, cough which is sometimes productive of a green-tinged otherwise clear sputum along with shortness of breath.  She also endorses having nausea and vomiting along with diarrhea through yesterday, although the symptoms are currently improved.  She has been exposed to influenza, both her daughter and a grandchild have been diagnosed with influenza over the past several days, patient states she actually brought her grandchild here on Monday where he was diagnosed with the flu.  She has had no treatment prior to arrival.  If she has the flu she would want to take Tamiflu.    The history is provided by the patient.       Home Medications Prior to Admission medications   Medication Sig Start Date End Date Taking? Authorizing Provider  benzonatate (TESSALON) 100 MG capsule Take 2 capsules (200 mg total) by mouth 3 (three) times daily as needed. 07/26/22  Yes Amilee Janvier, Almyra Free, PA-C  oseltamivir (TAMIFLU) 75 MG capsule Take 1 capsule (75 mg total) by mouth every 12 (twelve) hours. 07/26/22  Yes Zalia Hautala, Almyra Free, PA-C  dicyclomine (BENTYL) 20 MG tablet Take 1 tablet (20 mg total) by mouth 2 (two) times daily. Patient not taking: Reported on 03/15/2022 02/16/22   Hendricks Limes, PA-C  HYDROcodone-acetaminophen (NORCO/VICODIN) 5-325 MG tablet Take one tab po q 4 hrs prn pain 06/05/22   Triplett, Tammy, PA-C  ibuprofen (ADVIL) 200 MG tablet Take 600 mg by mouth every 6 (six) hours as needed.    [provider]  naproxen (NAPROSYN) 500 MG tablet Take 1 tablet (500 mg total) by mouth 2 (two) times daily. Take with food 06/05/22   Triplett, Tammy,  PA-C  ondansetron (ZOFRAN-ODT) 4 MG disintegrating tablet Take 1 tablet (4 mg total) by mouth every 8 (eight) hours as needed for nausea or vomiting. Patient not taking: Reported on 03/15/2022 02/16/22   Hendricks Limes, PA-C      Allergies    Patient has no known allergies.    Review of Systems   Review of Systems  Constitutional:  Positive for fatigue and fever.  HENT:  Negative for congestion and sore throat.   Eyes: Negative.   Respiratory:  Positive for cough. Negative for chest tightness and shortness of breath.   Cardiovascular:  Negative for chest pain.  Gastrointestinal:  Positive for diarrhea, nausea and vomiting. Negative for abdominal pain.  Genitourinary: Negative.   Musculoskeletal:  Positive for myalgias. Negative for arthralgias, joint swelling and neck pain.  Skin: Negative.  Negative for rash and wound.  Neurological:  Positive for headaches. Negative for dizziness, weakness, light-headedness and numbness.  Psychiatric/Behavioral: Negative.      Physical Exam Updated Vital Signs BP 119/77 (BP Location: Right Arm)   Pulse 76   Temp 98.2 F (36.8 C) (Oral)   Resp 16   Ht '5\' 3"'$  (1.6 m)   Wt 74.8 kg   SpO2 99%   BMI 29.23 kg/m  Physical Exam Vitals and nursing note reviewed.  Constitutional:      Appearance: She is well-developed.  HENT:     Head: Normocephalic and atraumatic.     Nose: Rhinorrhea present.  Mouth/Throat:     Mouth: Mucous membranes are moist.     Pharynx: No oropharyngeal exudate or posterior oropharyngeal erythema.  Eyes:     Conjunctiva/sclera: Conjunctivae normal.  Cardiovascular:     Rate and Rhythm: Normal rate and regular rhythm.     Heart sounds: Normal heart sounds.  Pulmonary:     Effort: Pulmonary effort is normal.     Breath sounds: Normal breath sounds. No wheezing or rhonchi.  Abdominal:     General: Bowel sounds are normal.     Palpations: Abdomen is soft.     Tenderness: There is no abdominal tenderness.   Musculoskeletal:        General: Normal range of motion.     Cervical back: Normal range of motion.  Skin:    General: Skin is warm and dry.  Neurological:     Mental Status: She is alert.     ED Results / Procedures / Treatments   Labs (all labs ordered are listed, but only abnormal results are displayed) Labs Reviewed  RESP PANEL BY RT-PCR (FLU A&B, COVID) [id number]    EKG None  Radiology DG Chest 2 View  Result Date: 07/26/2022 CLINICAL DATA:  Cough and shortness of breath. EXAM: CHEST - 2 VIEW COMPARISON:  11/02/2015 FINDINGS: The heart size and mediastinal contours are within normal limits. Suggestion of mild bronchial thickening throughout both lungs which may be consistent with acute bronchitis. No overt airspace consolidation, edema, pleural fluid or pneumothorax. The visualized skeletal structures are unremarkable. IMPRESSION: Suggestion of mild bronchial thickening throughout both lungs which may be consistent with acute bronchitis. Electronically Signed   By: Aletta Edouard M.D.   On: 07/26/2022 12:41    Procedures Procedures    Medications Ordered in ED Medications  ibuprofen (ADVIL) tablet 800 mg (800 mg Oral Given 07/26/22 1236)    ED Course/ Medical Decision Making/ A&P                           Medical Decision Making Patient presenting with flulike symptoms with very obvious exposures, was here 2 days ago with her grandson who was positive for flu at that time, now patient's daughter also diagnosed with flu.  History and exam suggesting influenza.  Chest x-ray obtained as she had complaints of shortness of breath although her respiratory rate and pulse ox here has been stable.  No pneumonia on chest x-ray.  She is stable at time of discharge.  A respiratory panel has been collected but is currently pending.  Patient will be discharged home and will keep track of her MyChart results.  She does desire Tamiflu if she is positive for the flu, this has been prescribed  for her for her to pick up if her flu test is positive.  Return precautions were outlined.  Amount and/or Complexity of Data Reviewed Radiology: ordered and independent interpretation performed.    Details: Reviewed, no pneumonia.  Risk Prescription drug management.           Final Clinical Impression(s) / ED Diagnoses Final diagnoses:  Influenza    Rx / DC Orders ED Discharge Orders          Ordered    oseltamivir (TAMIFLU) 75 MG capsule  Every 12 hours        07/26/22 1424    benzonatate (TESSALON) 100 MG capsule  3 times daily PRN        07/26/22 1424  Evalee Jefferson, PA-C 07/26/22 1427    Godfrey Pick, MD 07/29/22 [phone]

## 2022-07-26 NOTE — Discharge Instructions (Signed)
Rest,  Drink plenty of fluids.  Take motrin or tylenol for achiness and fever reduction.  You may take the tamiflu if you desire.  This medicine may improve your flu symptoms 1-2 days sooner.  Get rechecked for increased shortness of breath,  Increased fever or increasing weakness.

## 2023-06-27 ENCOUNTER — Other Ambulatory Visit: Payer: Self-pay

## 2023-06-27 ENCOUNTER — Encounter (HOSPITAL_COMMUNITY): Payer: Self-pay | Admitting: Radiology

## 2023-06-27 ENCOUNTER — Emergency Department (HOSPITAL_COMMUNITY): Payer: Self-pay

## 2023-06-27 ENCOUNTER — Emergency Department (HOSPITAL_COMMUNITY): Admission: EM | Admit: 2023-06-27 | Discharge: 2023-06-27 | Discharge status: Against Medical Advice | Payer: 59

## 2023-06-27 DIAGNOSIS — H9202 Otalgia, left ear: Secondary | ICD-10-CM | POA: Diagnosis not present

## 2023-06-27 DIAGNOSIS — R519 Headache, unspecified: Secondary | ICD-10-CM | POA: Insufficient documentation

## 2023-06-27 DIAGNOSIS — R0789 Other chest pain: Secondary | ICD-10-CM | POA: Diagnosis not present

## 2023-06-27 DIAGNOSIS — R079 Chest pain, unspecified: Secondary | ICD-10-CM | POA: Diagnosis not present

## 2023-06-27 DIAGNOSIS — R0602 Shortness of breath: Secondary | ICD-10-CM | POA: Diagnosis not present

## 2023-06-27 DIAGNOSIS — Z5321 Procedure and treatment not carried out due to patient leaving prior to being seen by health care provider: Secondary | ICD-10-CM | POA: Insufficient documentation

## 2023-06-27 DIAGNOSIS — R072 Precordial pain: Secondary | ICD-10-CM | POA: Diagnosis not present

## 2023-06-27 LAB — CBC
HCT: 41.8 % (ref 36.0–46.0)
Hemoglobin: 14.1 g/dL (ref 12.0–15.0)
MCH: 28.6 pg (ref 26.0–34.0)
MCHC: 33.7 g/dL (ref 30.0–36.0)
MCV: 84.8 fL (ref 80.0–100.0)
Platelets: 270 10*3/uL (ref 150–400)
RBC: 4.93 MIL/uL (ref 3.87–5.11)
RDW: 17 % — ABNORMAL HIGH (ref 11.5–15.5)
WBC: 7.6 10*3/uL (ref 4.0–10.5)
nRBC: 0 % (ref 0.0–0.2)

## 2023-06-27 LAB — BASIC METABOLIC PANEL
Anion gap: 10 (ref 5–15)
BUN: 13 mg/dL (ref 6–20)
CO2: 23 mmol/L (ref 22–32)
Calcium: 9.2 mg/dL (ref 8.9–10.3)
Chloride: 103 mmol/L (ref 98–111)
Creatinine, Ser: 0.79 mg/dL (ref 0.44–1.00)
GFR, Estimated: >60 mL/min (ref >60)
Glucose, Bld: 90 mg/dL (ref 70–99)
Potassium: 4 mmol/L (ref 3.5–5.1)
Sodium: 136 mmol/L (ref 135–145)

## 2023-06-27 LAB — TROPONIN I (HIGH SENSITIVITY): Troponin I (High Sensitivity): <2 ng/L (ref <18)

## 2023-06-27 MED ORDER — ACETAMINOPHEN 325 MG PO TABS: 650.0000 mg | ORAL_TABLET | Freq: Once | ORAL | Status: DC

## 2023-06-27 NOTE — ED Provider Triage Note (Signed)
Emergency Medicine Provider Triage Evaluation Note  Marcia Wu , a 52 y.o. female  was evaluated in triage.  Pt complains of chest pain x 2 days. Constant. Notes some associated shortness of breath.  Think she may have pulled muscle while at work.  Also complaining of some ear pain and headache.  Review of Systems  Positive: Chest pain Negative: Fevers  Physical Exam  BP (!) 154/84 (BP Location: Right Arm)   Pulse (!) 57   Temp 98.1 F (36.7 C) (Oral)   Resp 16   Ht 5\' 3"  (1.6 m)   Wt 74.8 kg   SpO2 100%   BMI 29.23 kg/m  Gen:   Awake, no distress   Resp:  Normal effort  MSK:   Moves extremities without difficulty  Other:  Equal radial pulses.  Medical Decision Making  Medically screening exam initiated at 3:26 PM.  Appropriate orders placed.  Marcia Wu was informed that the remainder of the evaluation will be completed by another provider, this initial triage assessment does not replace that evaluation, and the importance of remaining in the ED until their evaluation is complete.  52 year old female with 2 days of chest pain.  Well-appearing. ECG re-assuring. Workup underway.    Coral Spikes, DO 06/27/23 1528

## 2023-06-27 NOTE — ED Triage Notes (Signed)
Mid sternal chest pain x3 days that is reproducible and does not radiate.Pt thinks she may have lifted or pulled on something at work. Pt also endorses headache and left ear pain.

## 2023-06-27 NOTE — ED Triage Notes (Signed)
Pt also with shortness of breath that started yesterday that is exacerbated by movement and relieved by sitting.

## 2023-12-13 ENCOUNTER — Other Ambulatory Visit: Payer: Self-pay

## 2023-12-13 ENCOUNTER — Encounter (HOSPITAL_COMMUNITY): Payer: Self-pay

## 2023-12-13 ENCOUNTER — Emergency Department (HOSPITAL_COMMUNITY)
Admission: EM | Admit: 2023-12-13 | Discharge: 2023-12-13 | Disposition: A | Discharge status: Home / Self Care | Payer: Self-pay | Attending: Emergency Medicine | Admitting: Emergency Medicine

## 2023-12-13 ENCOUNTER — Emergency Department (HOSPITAL_COMMUNITY): Payer: Self-pay

## 2023-12-13 DIAGNOSIS — K5900 Constipation, unspecified: Secondary | ICD-10-CM | POA: Insufficient documentation

## 2023-12-13 DIAGNOSIS — E876 Hypokalemia: Secondary | ICD-10-CM | POA: Insufficient documentation

## 2023-12-13 DIAGNOSIS — R300 Dysuria: Secondary | ICD-10-CM

## 2023-12-13 DIAGNOSIS — D259 Leiomyoma of uterus, unspecified: Secondary | ICD-10-CM | POA: Insufficient documentation

## 2023-12-13 DIAGNOSIS — R112 Nausea with vomiting, unspecified: Secondary | ICD-10-CM

## 2023-12-13 DIAGNOSIS — R1032 Left lower quadrant pain: Secondary | ICD-10-CM

## 2023-12-13 LAB — CBC WITH DIFFERENTIAL/PLATELET
Abs Immature Granulocytes: 0.02 10*3/uL (ref 0.00–0.07)
Basophils Absolute: 0 10*3/uL (ref 0.0–0.1)
Basophils Relative: 0 %
Eosinophils Absolute: 0 10*3/uL (ref 0.0–0.5)
Eosinophils Relative: 0 %
HCT: 46.3 % — ABNORMAL HIGH (ref 36.0–46.0)
Hemoglobin: 15.4 g/dL — ABNORMAL HIGH (ref 12.0–15.0)
Immature Granulocytes: 0 %
Lymphocytes Relative: 13 %
Lymphs Abs: 1.2 10*3/uL (ref 0.7–4.0)
MCH: 28.8 pg (ref 26.0–34.0)
MCHC: 33.3 g/dL (ref 30.0–36.0)
MCV: 86.5 fL (ref 80.0–100.0)
Monocytes Absolute: 1 10*3/uL (ref 0.1–1.0)
Monocytes Relative: 11 %
Neutro Abs: 7.2 10*3/uL (ref 1.7–7.7)
Neutrophils Relative %: 76 %
Platelets: 275 10*3/uL (ref 150–400)
RBC: 5.35 MIL/uL — ABNORMAL HIGH (ref 3.87–5.11)
RDW: 13.8 % (ref 11.5–15.5)
WBC: 9.5 10*3/uL (ref 4.0–10.5)
nRBC: 0 % (ref 0.0–0.2)

## 2023-12-13 LAB — COMPREHENSIVE METABOLIC PANEL WITH GFR
ALT: 10 U/L (ref 0–44)
AST: 19 U/L (ref 15–41)
Albumin: 3.4 g/dL — ABNORMAL LOW (ref 3.5–5.0)
Alkaline Phosphatase: 77 U/L (ref 38–126)
Anion gap: 15 (ref 5–15)
BUN: 12 mg/dL (ref 6–20)
CO2: 23 mmol/L (ref 22–32)
Calcium: 9.6 mg/dL (ref 8.9–10.3)
Chloride: 99 mmol/L (ref 98–111)
Creatinine, Ser: 0.57 mg/dL (ref 0.44–1.00)
GFR, Estimated: >60 mL/min (ref >60)
Glucose, Bld: 113 mg/dL — ABNORMAL HIGH (ref 70–99)
Potassium: 3.1 mmol/L — ABNORMAL LOW (ref 3.5–5.1)
Sodium: 137 mmol/L (ref 135–145)
Total Bilirubin: 0.7 mg/dL (ref 0.0–1.2)
Total Protein: 7.2 g/dL (ref 6.5–8.1)

## 2023-12-13 LAB — URINALYSIS, ROUTINE W REFLEX MICROSCOPIC
Bacteria, UA: NONE SEEN
Bilirubin Urine: NEGATIVE
Glucose, UA: NEGATIVE mg/dL
Ketones, ur: 20 mg/dL — AB
Leukocytes,Ua: NEGATIVE
Nitrite: NEGATIVE
Protein, ur: 30 mg/dL — AB
Specific Gravity, Urine: 1 — ABNORMAL LOW (ref 1.005–1.030)
pH: 8 (ref 5.0–8.0)

## 2023-12-13 LAB — LIPASE, BLOOD: Lipase: 34 U/L (ref 11–51)

## 2023-12-13 MED ORDER — IOHEXOL 300 MG/ML  SOLN
100.0000 mL | Freq: Once | INTRAMUSCULAR | Status: AC | PRN
  Administered 2023-12-13: 100 mL via INTRAVENOUS

## 2023-12-13 MED ORDER — HYDROCODONE-ACETAMINOPHEN 5-325 MG PO TABS
1.0000 | ORAL_TABLET | Freq: Once | ORAL | Status: AC
  Administered 2023-12-13: 1 via ORAL
  Filled 2023-12-13: qty 1

## 2023-12-13 MED ORDER — HYDROCODONE-ACETAMINOPHEN 5-325 MG PO TABS: 1.0000 | ORAL_TABLET | Freq: Four times a day (QID) | ORAL | 0 refills | Status: AC | PRN

## 2023-12-13 MED ORDER — DICYCLOMINE HCL 10 MG/ML IM SOLN
20.0000 mg | Freq: Once | INTRAMUSCULAR | Status: AC
  Administered 2023-12-13: 20 mg via INTRAMUSCULAR
  Filled 2023-12-13: qty 2

## 2023-12-13 MED ORDER — POLYETHYLENE GLYCOL 3350 17 G PO PACK: 17.0000 g | PACK | Freq: Every day | ORAL | 0 refills | Status: AC

## 2023-12-13 MED ORDER — NITROFURANTOIN MONOHYD MACRO 100 MG PO CAPS: 100.0000 mg | ORAL_CAPSULE | Freq: Two times a day (BID) | ORAL | 0 refills | Status: AC

## 2023-12-13 MED ORDER — LACTATED RINGERS IV BOLUS
1000.0000 mL | Freq: Once | INTRAVENOUS | Status: AC
  Administered 2023-12-13: 1000 mL via INTRAVENOUS

## 2023-12-13 MED ORDER — KETOROLAC TROMETHAMINE 15 MG/ML IJ SOLN
15.0000 mg | Freq: Once | INTRAMUSCULAR | Status: AC
  Administered 2023-12-13: 15 mg via INTRAVENOUS
  Filled 2023-12-13: qty 1

## 2023-12-13 MED ORDER — POTASSIUM CHLORIDE CRYS ER 20 MEQ PO TBCR
40.0000 meq | EXTENDED_RELEASE_TABLET | Freq: Once | ORAL | Status: AC
  Administered 2023-12-13: 40 meq via ORAL
  Filled 2023-12-13: qty 2

## 2023-12-13 MED ORDER — ONDANSETRON 4 MG PO TBDP: 4.0000 mg | ORAL_TABLET | Freq: Four times a day (QID) | ORAL | 0 refills | Status: AC | PRN

## 2023-12-13 MED ORDER — ONDANSETRON HCL 4 MG/2ML IJ SOLN
4.0000 mg | Freq: Once | INTRAMUSCULAR | Status: AC
  Administered 2023-12-13: 4 mg via INTRAVENOUS
  Filled 2023-12-13: qty 2

## 2023-12-13 NOTE — Discharge Instructions (Addendum)
 Pleasure taking care of you today.  You are seen for lower abdominal pain with nausea and vomiting. Regarding your abdominal pain-will prescribe you some pain medication.  It may be related to your large uterine fibroids.  [facility]  I am also giving you the number to follow-up with gastroenterology keep having pain and nausea. Prescribing Zofran  for nausea, MiraLAX  for constipation as you have been constipated and your CT shows moderate constipation on your CT as well. You were prescribed opiate pain medication Norco to take with an over-the-counter medication such as ibuprofen  does not help. This can have many sided effect such as drowsiness, slowed breathing, and conspitation. Do not take it with other medications that cause drowsiness, or drink alcohol or drive while taking it.   We are still waiting on your urinalysis, lab was not able to tell me a timeframe, since you are having some urinary symptoms we will prescribe you an antibiotic to take.

## 2023-12-13 NOTE — ED Triage Notes (Signed)
 Pt states her lower ABD is hurting since Friday, states she had N/V/D since. States she thought it was getting better but has gotten worse.

## 2023-12-13 NOTE — ED Provider Notes (Signed)
 [facility] AT [facility] Provider Note   CSN: [id number] Arrival date & time: 12/13/23  1144     History  Chief Complaint  Patient presents with   Abdominal Pain    Marcia Wu is a 53 y.o. female.  She denies chronic medical conditions.  She presents the ER today complaining of nausea vomiting and left lower quadrant abdominal pain.  She states 6 days ago started with nausea vomiting diarrhea lasted for 2 days, diarrhea has completely stopped she has had persistent nausea and generalized weakness since that time.  She states over the past 2 days the nausea and pain have gotten worse.  She denies hematemesis, medic easier or melena.  Denies fever but states she has had sweats that occurred when she initially got sick but have resolved.   Abdominal Pain      Home Medications Prior to Admission medications   Medication Sig Start Date End Date Taking? Authorizing Provider  benzonatate  (TESSALON ) 100 MG capsule Take 2 capsules (200 mg total) by mouth 3 (three) times daily as needed. 07/26/22   Idol, Julie, PA-C  dicyclomine  (BENTYL ) 20 MG tablet Take 1 tablet (20 mg total) by mouth 2 (two) times daily. Patient not taking: Reported on 03/15/2022 02/16/22   Darletta Ehrich, PA-C  HYDROcodone -acetaminophen  (NORCO/VICODIN) 5-325 MG tablet Take one tab po q 4 hrs prn pain 06/05/22   Triplett, Tammy, PA-C  ibuprofen  (ADVIL ) 200 MG tablet Take 600 mg by mouth every 6 (six) hours as needed.    [provider]  naproxen  (NAPROSYN ) 500 MG tablet Take 1 tablet (500 mg total) by mouth 2 (two) times daily. Take with food 06/05/22   Triplett, Tammy, PA-C  ondansetron  (ZOFRAN -ODT) 4 MG disintegrating tablet Take 1 tablet (4 mg total) by mouth every 8 (eight) hours as needed for nausea or vomiting. Patient not taking: Reported on 03/15/2022 02/16/22   Darletta Ehrich, PA-C  oseltamivir  (TAMIFLU ) 75 MG capsule Take 1 capsule (75 mg total) by mouth every  12 (twelve) hours. 07/26/22   Idol, Julie, PA-C      Allergies    Patient has no known allergies.    Review of Systems   Review of Systems  Gastrointestinal:  Positive for abdominal pain.    Physical Exam Updated Vital Signs BP 128/85   Pulse 64   Temp 97.7 F (36.5 C) (Temporal)   Resp 17   Ht 5\' 3"  (1.6 m)   Wt 63.5 kg   SpO2 100%   BMI 24.80 kg/m  Physical Exam Vitals and nursing note reviewed.  Constitutional:      General: She is not in acute distress.    Appearance: She is well-developed.  HENT:     Head: Normocephalic and atraumatic.  Eyes:     Conjunctiva/sclera: Conjunctivae normal.  Cardiovascular:     Rate and Rhythm: Normal rate and regular rhythm.     Heart sounds: No murmur heard. Pulmonary:     Effort: Pulmonary effort is normal. No respiratory distress.     Breath sounds: Normal breath sounds.  Abdominal:     General: Abdomen is flat.     Palpations: Abdomen is soft.     Tenderness: There is abdominal tenderness in the left lower quadrant. There is no right CVA tenderness, left CVA tenderness, guarding or rebound.  Musculoskeletal:        General: No swelling.     Cervical back: Neck supple.  Skin:  General: Skin is warm and dry.     Capillary Refill: Capillary refill takes less than 2 seconds.  Neurological:     General: No focal deficit present.     Mental Status: She is alert and oriented to person, place, and time.  Psychiatric:        Mood and Affect: Mood normal.     ED Results / Procedures / Treatments   Labs (all labs ordered are listed, but only abnormal results are displayed) Labs Reviewed  CBC WITH DIFFERENTIAL/PLATELET  COMPREHENSIVE METABOLIC PANEL WITH GFR  LIPASE, BLOOD  URINALYSIS, ROUTINE W REFLEX MICROSCOPIC    EKG None  Radiology No results found.  Procedures Procedures    Medications Ordered in ED Medications - No data to display  ED Course/ Medical Decision Making/ A&P                                  Medical Decision Making This patient presents to the ED for concern of left lower quadrant abdominal pain with nausea and vomiting, this involves an extensive number of treatment options, and is a complaint that carries with it a high risk of complications and morbidity.  The differential diagnosis includes gastritis, gastroenteritis, appendicitis, cholecystitis, diverticulitis, DKA, nephrolithiasis, gastroparesis, ovarian cyst, ovarian torsion, other    Co morbidities that complicate the patient evaluation  none   Additional history obtained:  Additional history obtained from EMR External records from outside source obtained and reviewed including prior notes, labs, imaging   Lab Tests:  I Ordered, and personally interpreted labs.  The pertinent results include:    Lipase-normal  CMP-moderate hypokalemia, repleted likely from vomiting CBC-mild polycythemia similar to previous   Imaging Studies ordered:  I ordered imaging studies including CT abdomen pelvis I independently visualized and interpreted imaging which showed large uterine fibroids as previous but no acute findings I agree with the radiologist interpretation     Problem List / ED Course / Critical interventions / Medication management  Nausea vomiting diarrhea-happened several days ago initially improved and then patient had recurrence of nausea and vomiting with left lower quadrant pain, now having constipation. she is now tolerating p.o. fluids and is feeling much better after receiving IV fluids, Bentyl , Toradol , Zofran .  She was still having some moderate left lower quadrant pain so given Norco with improvement of symptoms.  CT ordered due to significant pain and shows large fibroid uterus as previous.  Patient's prior record review reveals she has been here for similar pain in the past and also found to have this finding.  She states she has been told this both here and [facility] but has never pursued  further evaluation or treatment.  We discussed that she has to follow[facility]up with [facility].  Also follow-up with GI if symptoms do not resolve regarding the nausea and constipation.  I have reviewed the patients home medicines and have made adjustments as needed       Amount and/or Complexity of Data Reviewed Labs: ordered. Radiology: ordered.  Risk OTC drugs. Prescription drug management.           Final Clinical Impression(s) / ED Diagnoses Final diagnoses:  None    Rx / DC Orders ED Discharge Orders     None         Aimee Houseman, PA-C 12/13/23 1932    Teddi Favors, DO 12/15/23 [phone]

## 2023-12-14 ENCOUNTER — Emergency Department (HOSPITAL_COMMUNITY)
Admission: EM | Admit: 2023-12-14 | Discharge: 2023-12-14 | Disposition: A | Discharge status: Home / Self Care | Payer: Self-pay | Attending: Emergency Medicine | Admitting: Emergency Medicine

## 2023-12-14 ENCOUNTER — Encounter (HOSPITAL_COMMUNITY): Payer: Self-pay

## 2023-12-14 ENCOUNTER — Other Ambulatory Visit: Payer: Self-pay

## 2023-12-14 DIAGNOSIS — E860 Dehydration: Secondary | ICD-10-CM

## 2023-12-14 DIAGNOSIS — D259 Leiomyoma of uterus, unspecified: Secondary | ICD-10-CM | POA: Insufficient documentation

## 2023-12-14 DIAGNOSIS — K5900 Constipation, unspecified: Secondary | ICD-10-CM

## 2023-12-14 DIAGNOSIS — D219 Benign neoplasm of connective and other soft tissue, unspecified: Secondary | ICD-10-CM

## 2023-12-14 LAB — CBC WITH DIFFERENTIAL/PLATELET
Abs Immature Granulocytes: 0.02 10*3/uL (ref 0.00–0.07)
Basophils Absolute: 0 10*3/uL (ref 0.0–0.1)
Basophils Relative: 0 %
Eosinophils Absolute: 0 10*3/uL (ref 0.0–0.5)
Eosinophils Relative: 0 %
HCT: 39.9 % (ref 36.0–46.0)
Hemoglobin: 13.6 g/dL (ref 12.0–15.0)
Immature Granulocytes: 0 %
Lymphocytes Relative: 13 %
Lymphs Abs: 1.1 10*3/uL (ref 0.7–4.0)
MCH: 29.1 pg (ref 26.0–34.0)
MCHC: 34.1 g/dL (ref 30.0–36.0)
MCV: 85.4 fL (ref 80.0–100.0)
Monocytes Absolute: 0.8 10*3/uL (ref 0.1–1.0)
Monocytes Relative: 10 %
Neutro Abs: 6.7 10*3/uL (ref 1.7–7.7)
Neutrophils Relative %: 77 %
Platelets: 292 10*3/uL (ref 150–400)
RBC: 4.67 MIL/uL (ref 3.87–5.11)
RDW: 13.3 % (ref 11.5–15.5)
WBC: 8.7 10*3/uL (ref 4.0–10.5)
nRBC: 0 % (ref 0.0–0.2)

## 2023-12-14 LAB — COMPREHENSIVE METABOLIC PANEL WITH GFR
ALT: 11 U/L (ref 0–44)
AST: 14 U/L — ABNORMAL LOW (ref 15–41)
Albumin: 2.9 g/dL — ABNORMAL LOW (ref 3.5–5.0)
Alkaline Phosphatase: 74 U/L (ref 38–126)
Anion gap: 11 (ref 5–15)
BUN: 8 mg/dL (ref 6–20)
CO2: 25 mmol/L (ref 22–32)
Calcium: 8.8 mg/dL — ABNORMAL LOW (ref 8.9–10.3)
Chloride: 99 mmol/L (ref 98–111)
Creatinine, Ser: 0.6 mg/dL (ref 0.44–1.00)
GFR, Estimated: >60 mL/min (ref >60)
Glucose, Bld: 102 mg/dL — ABNORMAL HIGH (ref 70–99)
Potassium: 3.3 mmol/L — ABNORMAL LOW (ref 3.5–5.1)
Sodium: 135 mmol/L (ref 135–145)
Total Bilirubin: 0.8 mg/dL (ref 0.0–1.2)
Total Protein: 6.8 g/dL (ref 6.5–8.1)

## 2023-12-14 MED ORDER — SMOG ENEMA
960.0000 mL | Freq: Once | RECTAL | Status: AC
  Administered 2023-12-14: 960 mL via RECTAL
  Filled 2023-12-14: qty 960

## 2023-12-14 MED ORDER — KETOROLAC TROMETHAMINE 30 MG/ML IJ SOLN
30.0000 mg | Freq: Once | INTRAMUSCULAR | Status: AC
  Administered 2023-12-14: 30 mg via INTRAVENOUS
  Filled 2023-12-14: qty 1

## 2023-12-14 MED ORDER — FLEET ENEMA RE ENEM
1.0000 | ENEMA | Freq: Once | RECTAL | Status: AC
  Administered 2023-12-14: 1 via RECTAL

## 2023-12-14 MED ORDER — PROMETHAZINE HCL 25 MG PO TABS: 12.5000 mg | ORAL_TABLET | Freq: Four times a day (QID) | ORAL | 0 refills | Status: AC | PRN

## 2023-12-14 MED ORDER — SODIUM CHLORIDE 0.9 % IV BOLUS
1000.0000 mL | Freq: Once | INTRAVENOUS | Status: AC
  Administered 2023-12-14: 1000 mL via INTRAVENOUS

## 2023-12-14 MED ORDER — ONDANSETRON HCL 4 MG/2ML IJ SOLN
4.0000 mg | Freq: Once | INTRAMUSCULAR | Status: AC
  Administered 2023-12-14: 4 mg via INTRAVENOUS
  Filled 2023-12-14: qty 2

## 2023-12-14 NOTE — ED Provider Notes (Signed)
 [place] [facility] DEPARTMENT AT [facility] Provider Note   CSN: [id number] Arrival date & time: 12/14/23  1624     History  Chief Complaint  Patient presents with   Abdominal Pain    Marcia Wu is a 53 y.o. female.  Pt is a 53 yo female with pmhx significant for fibroids and constipation.  Pt was here yesterday due to abd pain and n/v.  She had a CT scan which showed constipation and fibroids.  Pt said she is not any better.  She has tried apple juice for the constipation, but she's vomited it up.       Home Medications Prior to Admission medications   Medication Sig Start Date End Date Taking? Authorizing Provider  promethazine (PHENERGAN) 25 MG tablet Take 0.5 tablets (12.5 mg total) by mouth every 6 (six) hours as needed for nausea or vomiting. 12/14/23  Yes Sueellen Emery, MD  acetaminophen  (TYLENOL ) 650 MG CR tablet Take 1,300 mg by mouth every 8 (eight) hours as needed for pain.    [provider]  HYDROcodone -acetaminophen  (NORCO/VICODIN) 5-325 MG tablet Take 1 tablet by mouth every 6 (six) hours as needed. 12/13/23   Baxter Limber A, PA-C  ibuprofen  (ADVIL ) 200 MG tablet Take 600 mg by mouth every 6 (six) hours as needed for moderate pain (pain score 4-6).    [provider]  nitrofurantoin , macrocrystal-monohydrate, (MACROBID ) 100 MG capsule Take 1 capsule (100 mg total) by mouth 2 (two) times daily. 12/13/23   Baxter Limber A, PA-C  ondansetron  (ZOFRAN -ODT) 4 MG disintegrating tablet Take 1 tablet (4 mg total) by mouth every 6 (six) hours as needed for nausea or vomiting. 12/13/23   Baxter Limber A, PA-C  polyethylene glycol (MIRALAX ) 17 g packet Take 17 g by mouth daily. 12/13/23   Baxter Limber A, PA-C      Allergies    Patient has no known allergies.    Review of Systems   Review of Systems  Gastrointestinal:  Positive for abdominal pain, constipation, nausea and vomiting.  All other systems reviewed and are  negative.   Physical Exam Updated Vital Signs BP (!) 136/58   Pulse (!) 59   Temp 98 F (36.7 C) (Oral)   Resp 16   Ht 5\' 3"  (1.6 m)   Wt 63.5 kg   SpO2 96%   BMI 24.80 kg/m  Physical Exam Vitals and nursing note reviewed.  Constitutional:      Appearance: She is well-developed.  HENT:     Head: Normocephalic and atraumatic.     Mouth/Throat:     Mouth: Mucous membranes are moist.     Pharynx: Oropharynx is clear.  Eyes:     Extraocular Movements: Extraocular movements intact.     Pupils: Pupils are equal, round, and reactive to light.  Cardiovascular:     Rate and Rhythm: Normal rate and regular rhythm.     Heart sounds: Normal heart sounds.  Pulmonary:     Effort: Pulmonary effort is normal.     Breath sounds: Normal breath sounds.  Abdominal:     General: Abdomen is flat. Bowel sounds are normal.     Palpations: Abdomen is soft.     Tenderness: There is generalized abdominal tenderness.  Skin:    General: Skin is warm.     Capillary Refill: Capillary refill takes less than 2 seconds.  Neurological:     General: No focal deficit present.     Mental Status: She is  alert and oriented to person, place, and time.  Psychiatric:        Mood and Affect: Mood normal.        Behavior: Behavior normal.     ED Results / Procedures / Treatments   Labs (all labs ordered are listed, but only abnormal results are displayed) Labs Reviewed  COMPREHENSIVE METABOLIC PANEL WITH GFR - Abnormal; Notable for the following components:      Result Value   Potassium 3.3 (*)    Glucose, Bld 102 (*)    Calcium 8.8 (*)    Albumin 2.9 (*)    AST 14 (*)    All other components within normal limits  CBC WITH DIFFERENTIAL/PLATELET    EKG None  Radiology CT ABDOMEN PELVIS W CONTRAST Result Date: 12/13/2023 CLINICAL DATA:  Left lower quadrant abdominal pain EXAM: CT ABDOMEN AND PELVIS WITH CONTRAST TECHNIQUE: Multidetector CT imaging of the abdomen and pelvis was performed using  the standard protocol following bolus administration of intravenous contrast. RADIATION DOSE REDUCTION: This exam was performed according to the departmental dose-optimization program which includes automated exposure control, adjustment of the mA and/or kV according to patient size and/or use of iterative reconstruction technique. CONTRAST:  OMNIPAQUE  IOHEXOL  300 MG/ML  SOLN COMPARISON:  CT abdomen and pelvis February 13, 2022 FINDINGS: Lower chest: No infiltrates or consolidations, no pleural effusions Hepatobiliary: Liver normal size no masses no biliary dilatation. Gallbladder unremarkable. No gallstones. Pancreas: Pancreas normal size. No masses calcifications or inflammatory changes. Spleen: Spleen normal size.  No masses. Adrenals/Urinary Tract: Adrenal glands are normal size. Follow-up recommended. Kidneys are normal. No masses calcifications or hydronephrosis Stomach/Bowel: No small or large bowel obstruction or inflammatory changes. Moderate amount of residual fecal material throughout the colon without obstruction or constipation. Vascular/Lymphatic: No significant vascular findings are present. No enlarged abdominal or pelvic lymph nodes. Reproductive: Comparison with prior examinations demonstrates again enlarged, inhomogeneous lobular appearing uterus measuring 13 by 9.7 x 10 cm in the longitudinal, AP and transverse diameter. Large enhancing nodular mass along the anterior margin of the body of the uterus measures 6.4 x 7.8 cm, deforming the endometrium. With a IUD in place in the mid lower endometrial distribution. Appears compressed by the large mass a second mass along the lower uterine segment anterior and to the left of the midline measuring 5 by 3.7 cm also correlates with a uterine fibroid. These findings are similar to those noted on the prior CT and pelvic ultrasound February 16, 2022. No adnexal masses or inflammatory changes of the ovaries. Other: Anterior abdominal wall unremarkable without  evidence of umbilical or inguinal hernias Musculoskeletal: Visualized portion of the thoracolumbar spine and pelvic structures grossly unremarkable without evidence of fracture bony abnormalities or soft tissue masses. IMPRESSION: *Enlarged uterus with multiple uterine fibroids as described above. *IUD in place in the mid lower endometrial distribution. *No adnexal masses or inflammatory changes of the ovaries. *Moderate amount of residual fecal material throughout the colon without obstruction or constipation. *No other significant findings. Electronically Signed   By: Fredrich Jefferson M.D.   On: 12/13/2023 15:47    Procedures Procedures    Medications Ordered in ED Medications  sodium chloride  0.9 % bolus 1,000 mL (0 mLs Intravenous Stopped 12/14/23 2120)  ondansetron  (ZOFRAN ) injection 4 mg (4 mg Intravenous Given 12/14/23 1817)  ketorolac  (TORADOL ) 30 MG/ML injection 30 mg (30 mg Intravenous Given 12/14/23 1817)  sorbitol , magnesium hydroxide, mineral oil, glycerin (SMOG) enema (960 mLs Rectal Given 12/14/23 2030)  sodium phosphate (FLEET) enema 1 enema (1 enema Rectal Given 12/14/23 1855)    ED Course/ Medical Decision Making/ A&P                                 Medical Decision Making Amount and/or Complexity of Data Reviewed Labs: ordered.  Risk OTC drugs. Prescription drug management.   This patient presents to the ED for concern of abd pain, this involves an extensive number of treatment options, and is a complaint that carries with it a high risk of complications and morbidity.  The differential diagnosis includes constipation, fibroid   Co morbidities that complicate the patient evaluation  fibroids and constipation   Additional history obtained:  Additional history obtained from epic chart review External records from outside source obtained and reviewed including family   Lab Tests:  I Ordered, and personally interpreted labs.  The pertinent results include:  cbc nl,  cmp nl   Imaging Studies ordered:  I reviewed films from yesterday   Medicines ordered and prescription drug management:  I ordered medication including fleet/smog  for sx  Reevaluation of the patient after these medicines showed that the patient improved I have reviewed the patients home medicines and have made adjustments as needed   Test Considered:  ct   Critical Interventions:  enema  Problem List / ED Course:  Constipation:  resolved after SMOG enema.  Pt feels much better.  Pt did not pick up her prescriptions from [facility] from yesterday as they were too expensive.  She is told she can take otc meds instead.  She is stable for d/c.  Return if worse.    Reevaluation:  After the interventions noted above, I reevaluated the patient and found that they have :improved   Social Determinants of Health:  Lives at home   Dispostion:  After consideration of the diagnostic results and the patients response to treatment, I feel that the patent would benefit from discharge with outpatient f/u.          Final Clinical Impression(s) / ED Diagnoses Final diagnoses:  Constipation, unspecified constipation type  Dehydration  Fibroid    Rx / DC Orders ED Discharge Orders          Ordered    promethazine (PHENERGAN) 25 MG tablet  Every 6 hours PRN        12/14/23 2300              Ramez Arrona, MD 12/14/23 2324

## 2023-12-14 NOTE — ED Notes (Signed)
 Patient feels some relief with enema. No large or hard stools in commode, all loose or pebble-like.

## 2023-12-14 NOTE — ED Notes (Signed)
 Went over [employer]. All questions answered. Work note provided. IV removed

## 2023-12-14 NOTE — ED Triage Notes (Signed)
 Pt seen here yesterday for abd pain tht has gotten worse since then. Pt did state that she has not had a bm for a week.

## 2023-12-31 ENCOUNTER — Encounter: Payer: Self-pay | Admitting: Internal Medicine

## 2024-01-01 ENCOUNTER — Ambulatory Visit: Payer: Self-pay | Admitting: Adult Health

## 2024-01-10 ENCOUNTER — Ambulatory Visit: Payer: Self-pay | Admitting: Adult Health
